# Patient Record
Sex: Female | Born: 1995 | Race: Black or African American | Hispanic: No | Marital: Single | Smoking: Former smoker
Health system: Southern US, Community
[De-identification: ages and names within clinical notes are randomized; demographics above are authoritative.]

## PROBLEM LIST (undated history)

## (undated) ENCOUNTER — Inpatient Hospital Stay (HOSPITAL_COMMUNITY): Payer: Self-pay

## (undated) DIAGNOSIS — N390 Urinary tract infection, site not specified: Secondary | ICD-10-CM

## (undated) DIAGNOSIS — I10 Essential (primary) hypertension: Secondary | ICD-10-CM

## (undated) HISTORY — PX: NO PAST SURGERIES: SHX2092

---

## 2014-10-27 LAB — OB RESULTS CONSOLE HIV ANTIBODY (ROUTINE TESTING): HIV: NONREACTIVE

## 2014-10-27 LAB — OB RESULTS CONSOLE RPR: RPR: NONREACTIVE

## 2014-10-27 LAB — OB RESULTS CONSOLE HEPATITIS B SURFACE ANTIGEN: Hepatitis B Surface Ag: NEGATIVE

## 2014-10-27 LAB — OB RESULTS CONSOLE RUBELLA ANTIBODY, IGM: RUBELLA: IMMUNE

## 2014-12-08 LAB — OB RESULTS CONSOLE GC/CHLAMYDIA
Chlamydia: NEGATIVE
GC PROBE AMP, GENITAL: NEGATIVE

## 2015-01-31 NOTE — L&D Delivery Note (Addendum)
Final Labor Progress Note At  1020 pt reports an increased in rectal pressure.  VE C/C/+2.  FHR remained reassuring with occasional decelerations.  Vaginal Delivery Note The pt utilized an epidural as pain management.   Artificial rupture of membranes today, at 0936, clear.  GBS was positive, PCN x 7 doses were given.  Cervical dilation was complete at  0936.  NICHD Category 2.    Pushing with guidance began at  0850.   After 1 hour and 42 minutes of pushing the head, shoulders and the body of a viable female infant "[name]" delivered spontaneously with maternal effort in the LOP position at 1032. Loose [place] x 1 infant somersaulted thru without difficulty.  With decreased tone and minimal cry, the infant was placed on moms abd and provided tactile stimulation. After the umbilical cord was clamped it was cut by the FOB.  While the cord blood was obtained for evaluation the infant taken to the warmer by the nursing team for further tactile stimulation.   Spontaneous delivery of a intact placenta with a 3 vessel cord via [place] at 1038.   Episiotomy: None   The vulva, perineum, vaginal vault, rectum and cervix were inspected no repairs needed.   Postpartum pitocin as ordered.  Fundus firm, lochia minimum, bleeding under control.  EBL 100, Pt hemodynamically stable.   Sponge, laps and needle count correct and verified with the primary care nurse.  Attending MD available at all times.    Routine postpartum orders   Mother unsure about method of contraception Mom does not plan to breastfeed, prefers to bottlefeed Infant to have outpatient circumcision   Placenta to pathology: NO     Cord Gases sent to lab: NO Cord blood sent to lab: YES   APGARS:  6 at 1 minute and 9 at 5 minutes Weight:. 5lbs 2.2 oz   Both mom and baby were left in stable condition, baby skin to skin.   [name], CNM, MSN 05/06/2015. 11:33 AM

## 2015-04-28 LAB — OB RESULTS CONSOLE GBS: STREP GROUP B AG: POSITIVE

## 2015-05-04 ENCOUNTER — Other Ambulatory Visit: Payer: Self-pay | Admitting: Obstetrics and Gynecology

## 2015-05-04 ENCOUNTER — Telehealth (HOSPITAL_COMMUNITY): Payer: Self-pay | Admitting: *Deleted

## 2015-05-04 NOTE — H&P (Signed)
Lynn Garcia is a 20 y.o. female, G1P0 at 38.6 weeks, presenting for IOL secondary to oligohydramnios and chronic hypertension.  Patient personal and pregnancy history also significant for depressive disorder, ADHD, and GBS positive status.    Patient Active Problem List   Diagnosis Date Noted  . Oligohydramnios in third trimester 05/05/2015  . Chronic hypertension 05/05/2015    History of present pregnancy: Patient entered care at 11.5 weeks at [facility] in [place]. Limited PN Records Obtained EDC of 05/13/2015 was established by Definite LMP of 08/06/2014.   Anatomy scan:  Unknown weeks, with normal findings and an anterior placenta.   Additional US evaluations:  38wks:  VTX, EFW 6+4, 32%ILE, AFI 6.35, 3%ILE, ANTERIOR PLACENTA. BPP 8/8.  38.4wks: SIUP, vertex, anterior placenta, AFI=5cm, <3%, cervix not seen per protocol. Bpp 8/8 in 15 min. [place]/ovaries unremarkable Significant prenatal events: 1st Trimester: Treated for trichomoniasis and started on zoloft for depression.  Patient reports taking one pill and d/c'd due to not liking effects.  Patient with positive drug screen for MJ usage, reports d/c after positive pregnancy test.  2nd Trimester: Unknown 3rd Trimester:   Patient transferred to [facility] at 37.5wks for LA after a 4 week lapse in care. Reports contractions that had to be stopped at 32 weeks. Patient c/o increased swelling, headache, and elevated bp.   Last evaluation:  05/03/2015 in office by R. Stall, CNM FHR 140.  BP 146/96 Wt 173.5, TWG 60lbs  OB History    No data available     No past medical history on file. No past surgical history on file. Family History: family history is not on file. Social History:  has no tobacco, alcohol, and drug history on file.   Prenatal Transfer Tool  Maternal Diabetes: No Genetic Screening: Normal Maternal Ultrasounds/Referrals: Abnormal:  Findings:   Other: Oligo at 38wks Fetal Ultrasounds or other Referrals:   None Maternal Substance Abuse:  Yes:  Type: Marijuana Significant Maternal Medications:  None Significant Maternal Lab Results: Lab values include: Group B Strep positive   ROS:  -Ctx, -LoF, -VB, +FM Patient denies GI issues and recent illness Patient reports current headache and denies visual disturbances, epigastric pain, and SOB  Allergies not on file     Height 5\' 1"  (1.549 m), weight 74.844 kg (165 lb).  Physical Exam  Constitutional: She is oriented to person, place, and time. She appears well-developed and well-nourished. No distress.  HENT:  Head: Normocephalic and atraumatic.  Eyes: Conjunctivae are normal.  Neck: Normal range of motion.  Cardiovascular: Normal rate and normal heart sounds.   Respiratory: Effort normal and breath sounds normal.  GI: Soft. Bowel sounds are normal.  Gravid--fundal height appears AGA, Soft, NT  Musculoskeletal: Normal range of motion. She exhibits edema (+1 Pitting in BLE).  Neurological: She is alert and oriented to person, place, and time.  Skin: Skin is warm and dry.  Psychiatric: She has a normal mood and affect. Her behavior is normal.    [place]: EFW: 6lb 4oz by 38wk US Presentation: Vertex by 38wk US  FHR: 125 bpm, Min Var, +Variable Decels, +Accels UCs:  Q2-544min, palpates mild  Prenatal labs: ABO, Rh:  A Positive Antibody:  Negative Rubella:  Immune RPR:   NR HBsAg:   Negative HIV:   NR GBS:  Positive Sickle cell/Hgb electrophoresis:  Normal Pap:  N/A GC:  Negative Chlamydia:  Negative Other:  3/30: [facility] Labs-HGB 12.5, PLAT 205, WBC 11.3, URIC ACID 4.3, LDH 196, BUN 0.63, CREAT  7, PCR 0.113. MJ Negative  28 week labs 02/16/15--GTT 87, Hgb 12.9, plat 230, HIV NR, RPR NR; CF testing negative, Quad screen negative 11/6; GC/chlamydia negative 12/08/14 and 10/27/14. 10/27/14 Rubella immune, Hep B negative, RPR NR, HIV NR. UDS + MJ, Hgb 12.4, plat 229, A+, antibody screen neg, urine culture Ecoli at [facility], hgb electrophoresis  AA   Assessment IUP at 38.6wks Cat I FT CHTN Oligohydramnios IOL GBS Positive Depressive D/O Headache  Plan: Admit to [employer] per medical induction protocol Routine Labor and Delivery Orders per [facility] Protocol Routine Induction/Augmentation Orders [facility] labs for baseline reference In room to complete assessment and discuss POC: Cytotec placed by nurse, per provider orders,at 0145 Discussed r/b of induction including fetal distress, serial induction, pain, and increased risk of c/s delivery Will give tylenol for headache Encouraged rest Dr.EK to be updated as appropriate  Joellyn Quails, MSN 05/04/2015, 9:47 PM

## 2015-05-04 NOTE — Telephone Encounter (Signed)
Preadmission screen  

## 2015-05-05 ENCOUNTER — Inpatient Hospital Stay (HOSPITAL_COMMUNITY): Payer: Medicaid Other | Admitting: Anesthesiology

## 2015-05-05 ENCOUNTER — Encounter (HOSPITAL_COMMUNITY): Payer: Self-pay

## 2015-05-05 ENCOUNTER — Inpatient Hospital Stay (HOSPITAL_COMMUNITY)
Admission: RE | Admit: 2015-05-05 | Discharge: 2015-05-08 | DRG: 774 | Disposition: A | Discharge status: Home / Self Care | Payer: Medicaid Other | Source: Ambulatory Visit | Attending: Obstetrics and Gynecology | Admitting: Obstetrics and Gynecology

## 2015-05-05 DIAGNOSIS — Z3A38 38 weeks gestation of pregnancy: Secondary | ICD-10-CM

## 2015-05-05 DIAGNOSIS — O1002 Pre-existing essential hypertension complicating childbirth: Secondary | ICD-10-CM | POA: Diagnosis present

## 2015-05-05 DIAGNOSIS — O4103X0 Oligohydramnios, third trimester, not applicable or unspecified: Secondary | ICD-10-CM | POA: Diagnosis present

## 2015-05-05 DIAGNOSIS — O99344 Other mental disorders complicating childbirth: Secondary | ICD-10-CM | POA: Diagnosis present

## 2015-05-05 DIAGNOSIS — O99824 Streptococcus B carrier state complicating childbirth: Secondary | ICD-10-CM | POA: Diagnosis present

## 2015-05-05 DIAGNOSIS — I10 Essential (primary) hypertension: Secondary | ICD-10-CM | POA: Diagnosis present

## 2015-05-05 DIAGNOSIS — F329 Major depressive disorder, single episode, unspecified: Secondary | ICD-10-CM | POA: Diagnosis present

## 2015-05-05 DIAGNOSIS — F909 Attention-deficit hyperactivity disorder, unspecified type: Secondary | ICD-10-CM | POA: Diagnosis present

## 2015-05-05 LAB — RAPID HIV SCREEN (HIV 1/2 AB+AG)
HIV 1/2 ANTIBODIES: NONREACTIVE
HIV-1 P24 ANTIGEN - HIV24: NONREACTIVE

## 2015-05-05 LAB — CBC
HCT: 37 % (ref 36.0–46.0)
HEMATOCRIT: 36.8 % (ref 36.0–46.0)
HEMOGLOBIN: 13.1 g/dL (ref 12.0–15.0)
HEMOGLOBIN: 13 g/dL (ref 12.0–15.0)
MCH: 31.5 pg (ref 26.0–34.0)
MCH: 31.4 pg (ref 26.0–34.0)
MCHC: 35.6 g/dL (ref 30.0–36.0)
MCHC: 35.1 g/dL (ref 30.0–36.0)
MCV: 88.5 fL (ref 78.0–100.0)
MCV: 89.4 fL (ref 78.0–100.0)
PLATELETS: 177 10*3/uL (ref 150–400)
Platelets: 201 10*3/uL (ref 150–400)
RBC: 4.16 MIL/uL (ref 3.87–5.11)
RBC: 4.14 MIL/uL (ref 3.87–5.11)
RDW: 14.1 % (ref 11.5–15.5)
RDW: 14.1 % (ref 11.5–15.5)
WBC: 13.4 10*3/uL — ABNORMAL HIGH (ref 4.0–10.5)
WBC: 13.8 10*3/uL — ABNORMAL HIGH (ref 4.0–10.5)

## 2015-05-05 LAB — COMPREHENSIVE METABOLIC PANEL
ALBUMIN: 3.3 g/dL — AB (ref 3.5–5.0)
ALK PHOS: 92 U/L (ref 38–126)
ALT: 18 U/L (ref 14–54)
AST: 19 U/L (ref 15–41)
Anion gap: 6 (ref 5–15)
BUN: 9 mg/dL (ref 6–20)
CALCIUM: 8.7 mg/dL — AB (ref 8.9–10.3)
CO2: 23 mmol/L (ref 22–32)
CREATININE: 0.58 mg/dL (ref 0.44–1.00)
Chloride: 106 mmol/L (ref 101–111)
GFR calc Af Amer: >60 mL/min (ref >60)
GFR calc non Af Amer: >60 mL/min (ref >60)
GLUCOSE: 76 mg/dL (ref 65–99)
Potassium: 4 mmol/L (ref 3.5–5.1)
SODIUM: 135 mmol/L (ref 135–145)
Total Bilirubin: 0.2 mg/dL — ABNORMAL LOW (ref 0.3–1.2)
Total Protein: 6.8 g/dL (ref 6.5–8.1)

## 2015-05-05 LAB — LACTATE DEHYDROGENASE: LDH: 174 U/L (ref 98–192)

## 2015-05-05 LAB — TYPE AND SCREEN
ABO/RH(D): A POS
Antibody Screen: NEGATIVE

## 2015-05-05 LAB — PROTEIN / CREATININE RATIO, URINE
Creatinine, Urine: 197 mg/dL
Protein Creatinine Ratio: 0.1 mg/mg{Cre} (ref 0.00–0.15)
Total Protein, Urine: 19 mg/dL

## 2015-05-05 LAB — ABO/RH: ABO/RH(D): A POS

## 2015-05-05 LAB — URIC ACID: Uric Acid, Serum: 4.6 mg/dL (ref 2.3–6.6)

## 2015-05-05 LAB — RPR: RPR Ser Ql: NONREACTIVE

## 2015-05-05 MED ORDER — ONDANSETRON HCL 4 MG/2ML IJ SOLN: 4.0000 mg | Freq: Four times a day (QID) | INTRAMUSCULAR | Status: DC | PRN

## 2015-05-05 MED ORDER — OXYTOCIN BOLUS FROM INFUSION
500.0000 mL | INTRAVENOUS | Status: DC
  Administered 2015-05-06: 500 mL via INTRAVENOUS

## 2015-05-05 MED ORDER — FENTANYL 2.5 MCG/ML BUPIVACAINE 1/10 % EPIDURAL INFUSION (WH - ANES)
14.0000 mL/h | INTRAMUSCULAR | Status: DC | PRN
  Administered 2015-05-05 – 2015-05-06 (×4): 14 mL/h via EPIDURAL
  Filled 2015-05-05 (×3): qty 125

## 2015-05-05 MED ORDER — LACTATED RINGERS IV SOLN
500.0000 mL | Freq: Once | INTRAVENOUS | Status: AC
  Administered 2015-05-05: 500 mL via INTRAVENOUS

## 2015-05-05 MED ORDER — LABETALOL HCL 5 MG/ML IV SOLN: 20.0000 mg | INTRAVENOUS | Status: DC | PRN

## 2015-05-05 MED ORDER — LIDOCAINE HCL (PF) 1 % IJ SOLN
INTRAMUSCULAR | Status: DC | PRN
  Administered 2015-05-05: 5 mL
  Administered 2015-05-05: 2 mL
  Administered 2015-05-05: 3 mL

## 2015-05-05 MED ORDER — PENICILLIN G POTASSIUM 5000000 UNITS IJ SOLR
2.5000 10*6.[IU] | INTRAVENOUS | Status: DC
  Filled 2015-05-05 (×3): qty 2.5

## 2015-05-05 MED ORDER — TERBUTALINE SULFATE 1 MG/ML IJ SOLN
0.2500 mg | Freq: Once | INTRAMUSCULAR | Status: DC | PRN
Start: 2015-05-05 — End: 2015-05-06
  Filled 2015-05-05: qty 1

## 2015-05-05 MED ORDER — PENICILLIN G POTASSIUM 5000000 UNITS IJ SOLR
2.5000 10*6.[IU] | INTRAVENOUS | Status: DC
  Administered 2015-05-05 – 2015-05-06 (×6): 2.5 10*6.[IU] via INTRAVENOUS
  Filled 2015-05-05 (×10): qty 2.5

## 2015-05-05 MED ORDER — DEXTROSE 5 % IV SOLN
5.0000 10*6.[IU] | Freq: Once | INTRAVENOUS | Status: AC
  Administered 2015-05-05: 5 10*6.[IU] via INTRAVENOUS
  Filled 2015-05-05 (×2): qty 5

## 2015-05-05 MED ORDER — FENTANYL CITRATE (PF) 100 MCG/2ML IJ SOLN
50.0000 ug | INTRAMUSCULAR | Status: DC | PRN
  Administered 2015-05-05: 100 ug via INTRAVENOUS
  Filled 2015-05-05: qty 2

## 2015-05-05 MED ORDER — LACTATED RINGERS IV SOLN
2.5000 [IU]/h | INTRAVENOUS | Status: DC
  Filled 2015-05-05: qty 4

## 2015-05-05 MED ORDER — PHENYLEPHRINE 40 MCG/ML (10ML) SYRINGE FOR IV PUSH (FOR BLOOD PRESSURE SUPPORT)
80.0000 ug | PREFILLED_SYRINGE | INTRAVENOUS | Status: DC | PRN
  Filled 2015-05-05: qty 20
  Filled 2015-05-05: qty 2

## 2015-05-05 MED ORDER — HYDRALAZINE HCL 20 MG/ML IJ SOLN: 10.0000 mg | Freq: Once | INTRAMUSCULAR | Status: DC | PRN

## 2015-05-05 MED ORDER — OXYTOCIN 10 UNIT/ML IJ SOLN
1.0000 m[IU]/min | INTRAVENOUS | Status: DC
  Administered 2015-05-05: 1 m[IU]/min via INTRAVENOUS

## 2015-05-05 MED ORDER — LACTATED RINGERS IV SOLN
INTRAVENOUS | Status: DC
  Administered 2015-05-05 – 2015-05-06 (×4): via INTRAVENOUS

## 2015-05-05 MED ORDER — LIDOCAINE HCL (PF) 1 % IJ SOLN
30.0000 mL | INTRAMUSCULAR | Status: DC | PRN
  Filled 2015-05-05: qty 30

## 2015-05-05 MED ORDER — EPHEDRINE 5 MG/ML INJ
10.0000 mg | INTRAVENOUS | Status: DC | PRN
  Filled 2015-05-05: qty 2

## 2015-05-05 MED ORDER — LACTATED RINGERS IV SOLN
500.0000 mL | INTRAVENOUS | Status: DC | PRN
  Administered 2015-05-05 (×2): 500 mL via INTRAVENOUS

## 2015-05-05 MED ORDER — PHENYLEPHRINE 40 MCG/ML (10ML) SYRINGE FOR IV PUSH (FOR BLOOD PRESSURE SUPPORT)
80.0000 ug | PREFILLED_SYRINGE | INTRAVENOUS | Status: DC | PRN
  Filled 2015-05-05: qty 2

## 2015-05-05 MED ORDER — LACTATED RINGERS IV SOLN: 500.0000 mL | Freq: Once | INTRAVENOUS | Status: DC

## 2015-05-05 MED ORDER — LACTATED RINGERS IV SOLN
INTRAVENOUS | Status: DC
  Administered 2015-05-05 – 2015-05-06 (×2): via INTRAUTERINE

## 2015-05-05 MED ORDER — CITRIC ACID-SODIUM CITRATE 334-500 MG/5ML PO SOLN: 30.0000 mL | ORAL | Status: DC | PRN

## 2015-05-05 MED ORDER — ACETAMINOPHEN 325 MG PO TABS
650.0000 mg | ORAL_TABLET | ORAL | Status: DC | PRN
  Administered 2015-05-05: 650 mg via ORAL
  Filled 2015-05-05: qty 2

## 2015-05-05 MED ORDER — MISOPROSTOL 25 MCG QUARTER TABLET
25.0000 ug | ORAL_TABLET | ORAL | Status: DC | PRN
  Administered 2015-05-05: 25 ug via VAGINAL
  Filled 2015-05-05: qty 1
  Filled 2015-05-05: qty 0.25

## 2015-05-05 MED ORDER — EPHEDRINE 5 MG/ML INJ
10.0000 mg | INTRAVENOUS | Status: DC | PRN
Start: 2015-05-05 — End: 2015-05-06
  Filled 2015-05-05: qty 2

## 2015-05-05 MED ORDER — DIPHENHYDRAMINE HCL 50 MG/ML IJ SOLN: 12.5000 mg | INTRAMUSCULAR | Status: DC | PRN

## 2015-05-05 NOTE — Anesthesia Preprocedure Evaluation (Signed)
Anesthesia Evaluation  Patient identified by MRN, date of birth, ID band Patient awake    Reviewed: Allergy & Precautions, NPO status , Patient's Chart, lab work & pertinent test results  Airway Mallampati: II       Dental   Pulmonary neg pulmonary ROS,    Pulmonary exam normal        Cardiovascular hypertension, Normal cardiovascular exam     Neuro/Psych Depression negative neurological ROS     GI/Hepatic negative GI ROS, Neg liver ROS,   Endo/Other  negative endocrine ROS  Renal/GU negative Renal ROS     Musculoskeletal   Abdominal   Peds  Hematology negative hematology ROS (+)   Anesthesia Other Findings   Reproductive/Obstetrics (+) Pregnancy (IOL for oligo and cHTN)                             Lab Results  Component Value Date   WBC 13.8* 05/05/2015   HGB 13.0 05/05/2015   HCT 37.0 05/05/2015   MCV 89.4 05/05/2015   PLT 177 05/05/2015   Lab Results  Component Value Date   CREATININE 0.58 05/05/2015   BUN 9 05/05/2015   NA 135 05/05/2015   K 4.0 05/05/2015   CL 106 05/05/2015   CO2 23 05/05/2015    Anesthesia Physical Anesthesia Plan  ASA: II  Anesthesia Plan: Epidural   Post-op Pain Management:    Induction:   Airway Management Planned: Natural Airway  Additional Equipment:   Intra-op Plan:   Post-operative Plan:   Informed Consent: I have reviewed the patients History and Physical, chart, labs and discussed the procedure including the risks, benefits and alternatives for the proposed anesthesia with the patient or authorized representative who has indicated his/her understanding and acceptance.     Plan Discussed with:   Anesthesia Plan Comments:         Anesthesia Quick Evaluation

## 2015-05-05 NOTE — Progress Notes (Signed)
Subjective: Tired but comfortable. FOB at bedside for support  Objective: BP 121/82 mmHg  Pulse 87  Temp(Src) 98.1 F (36.7 C) (Oral)  Resp 18  Ht 5\' 1"  (1.549 m)  Wt 74.844 kg (165 lb)  BMI 31.19 kg/m2  SpO2 100%      FHT: Category 2, 130 bpm, moderate variability, +accels, occasional variables UC:   regular, every 2-4 minutes SVE:   Dilation: 4.5 Effacement (%): 80 Station: -2, -1 Exam by:: R Corrisa Gibby CNM Membranes: AROM at 0936, scant, blood tinged fluid Induction: S/p cytotec, 1 dose at 0145 Pitocin: none Internal monitors: IUPC, replaced at 1354, placed without difficulty  MVus; 130-170 FSE: Placed without difficulty at 0954  Pain management: Epidrual GBS prophylaxis, x2 doses  Assessment:  IUP at 38.6wks IOL for CHTN, Oligohydramnios INadequate contractions AROM x 8.5 hrs Cat 2 GBS Positive  Plan: Continue current management  May consider augmentation with Pitocin, 1x1, if MVus continue to remain below 180.   Alphonzo Severanceachel Javaria Knapke CNM, [place] 05/05/2015, 6:04 PM

## 2015-05-05 NOTE — Anesthesia Procedure Notes (Signed)
Epidural Patient location during procedure: OB  Staffing Anesthesiologist: Marcene DuosFITZGERALD, Donzella Carrol Performed by: anesthesiologist   Preanesthetic Checklist Completed: patient identified, site marked, surgical consent, pre-op evaluation, timeout performed, IV checked, risks and benefits discussed and monitors and equipment checked  Epidural Patient position: sitting Prep: site prepped and draped and DuraPrep Patient monitoring: continuous pulse ox and blood pressure Approach: midline Location: L3-L4 Injection technique: LOR saline  Needle:  Needle type: Tuohy  Needle gauge: 17 G Needle length: 9 cm and 9 Needle insertion depth: 7 cm Catheter type: closed end flexible Catheter size: 19 Gauge Catheter at skin depth: 13 (12cm initially at the skin. Advanced to 13cm when pt laid in right lat decubitus position.) cm Test dose: negative  Assessment Events: blood not aspirated, injection not painful, no injection resistance, negative IV test and no paresthesia

## 2015-05-05 NOTE — Progress Notes (Signed)
Subjective:  Pt now comfortable with epidural, family at bedside   Objective: BP 118/74 mmHg  Pulse 90  Temp(Src) 97.9 F (36.6 C) (Oral)  Resp 18  Ht 5\' 1"  (1.549 m)  Wt 74.844 kg (165 lb)  BMI 31.19 kg/m2  SpO2 99%      FHT: Overall Cat 1 with  Intermittent Category 2 tracing when pt is on her back, 125 bpm, min-moderate variability, +accels,  decelerations UC:   regular, every 2-3 minutes SVE:   Dilation: 4 Effacement (%): 80 Station: -2 Exam by:: Alphonzo SeveranceStall, Chameka Mcmullen CNM  Membranes: AROM at [phone], scant, blood tinged fluid Induction: S/p cytotec, 1 dose at 0145 Pitocin: none Internal monitors: IUPC, replaced at 1354, placed without difficulty   MVus;   200 FSE: Placed without difficulty at 0954  Pain management:  Epidrual GBS prophylaxis,  x2 doses  Assessment:  IUP at 38.6wks IOL for CHTN, Oligohydramnios Adequate contractions AROM x 7hrs Cat 2 GBS Positive  Plan: Continue current management   Alphonzo SeveranceRachel Chaniyah Jahr CNM, [place] 05/05/2015, 3:47 PM

## 2015-05-05 NOTE — Progress Notes (Signed)
Labor Progress  Subjective: No complaints, comfortable with epidural.  Reviewed strip, explained the decels and the possibility of the need for a CS.  Pt expressed understanding and ok with the POC  Objective: BP 140/89 mmHg  Pulse 83  Temp(Src) 98.4 F (36.9 C) (Oral)  Resp 18  Ht 5\' 1"  (1.549 m)  Wt 165 lb (74.844 kg)  BMI 31.19 kg/m2  SpO2 100% I/O last 3 completed shifts: In: -  Out: 575 [Urine:575]   FHT: 135, moderate variability, + accel, ;early, variable decels CTX:  irregular, every 2-6 minutes Uterus gravid, soft non tender SVE:  Dilation: 4 Effacement (%): 80 Station: -2 Exam by:: v. Maveryk Renstrom Pitocin at 571mUn/min at 2008  Assessment:  IUP at 38.6 weeks IOL d/t cHTN NICHD: Category 2,  Membranes:  AROM x 12hrs, no s/s of infection Induction: pitocin Pain management: Epidural placement: at 1533 on 05/05/15 GBS positive Dr Molly Maduroobert aware, strip reviewed. IUPC/FSE  Plan: Continue labor plan Continuous monitoring Will reassess with cervical exam at 1000 or earlier if necessary Stop pitocin at 2053 Amnioinfusion with bolus of 300 cc only     Guido Comp, CNM, MSN 05/05/2015. 9:09 PM  Addendum Tachysystole, IUPC not adequately picking up ctx IUPC replaced at 2132  2148 FHR 140, + accel, variable and early decel  As of 2205 category 1, FHR 135, moderate variability,  No decels, ctx q 3 adequate

## 2015-05-05 NOTE — Progress Notes (Signed)
Subjective: In room to greet patient at 0730. Pt resting, doing well, feeling contractions and coping. Returned to room at 0930 to discuss plan of care with patient.   Objective: BP 127/73 mmHg  Pulse 78  Temp(Src) 97.9 F (36.6 C) (Oral)  Resp 18  Ht 5\' 1"  (1.549 m)  Wt 74.844 kg (165 lb)  BMI 31.19 kg/m2      Filed Vitals:   05/05/15 0237 05/05/15 0524 05/05/15 0603 05/05/15 0900  BP: 130/83 132/76 127/73   Pulse: 79 72 78   Temp:  97.8 F (36.6 C)  97.9 F (36.6 C)  TempSrc:  Oral  Oral  Resp: 16 18    Height:      Weight:       Results for orders placed or performed during the hospital encounter of 05/05/15 (from the past 24 hour(s))  CBC     Status: Abnormal   Collection Time: 05/05/15  2:11 AM  Result Value Ref Range   WBC 13.4 (H) 4.0 - 10.5 K/uL   RBC 4.16 3.87 - 5.11 MIL/uL   Hemoglobin 13.1 12.0 - 15.0 g/dL   HCT 19.136.8 47.836.0 - 29.546.0 %   MCV 88.5 78.0 - 100.0 fL   MCH 31.5 26.0 - 34.0 pg   MCHC 35.6 30.0 - 36.0 g/dL   RDW 62.114.1 30.811.5 - 65.715.5 %   Platelets 201 150 - 400 K/uL  Type and screen [facility]     Status: None   Collection Time: 05/05/15  2:11 AM  Result Value Ref Range   ABO/RH(D) A POS    Antibody Screen NEG    Sample Expiration 05/08/2015   Comprehensive metabolic panel     Status: Abnormal   Collection Time: 05/05/15  2:11 AM  Result Value Ref Range   Sodium 135 135 - 145 mmol/L   Potassium 4.0 3.5 - 5.1 mmol/L   Chloride 106 101 - 111 mmol/L   CO2 23 22 - 32 mmol/L   Glucose, Bld 76 65 - 99 mg/dL   BUN 9 6 - 20 mg/dL   Creatinine, Ser 8.460.58 0.44 - 1.00 mg/dL   Calcium 8.7 (L) 8.9 - 10.3 mg/dL   Total Protein 6.8 6.5 - 8.1 g/dL   Albumin 3.3 (L) 3.5 - 5.0 g/dL   AST 19 15 - 41 U/L   ALT 18 14 - 54 U/L   Alkaline Phosphatase 92 38 - 126 U/L   Total Bilirubin 0.2 (L) 0.3 - 1.2 mg/dL   GFR calc non Af Amer >60 >60 mL/min   GFR calc Af Amer >60 >60 mL/min   Anion gap 6 5 - 15  Lactate dehydrogenase     Status: None   Collection Time: 05/05/15  2:11 AM  Result Value Ref Range   LDH 174 98 - 192 U/L  Uric acid     Status: None   Collection Time: 05/05/15  2:11 AM  Result Value Ref Range   Uric Acid, Serum 4.6 2.3 - 6.6 mg/dL  Rapid HIV screen (HIV 1/2 Ab+Ag) ([facility] Only)     Status: None   Collection Time: 05/05/15  2:11 AM  Result Value Ref Range   HIV-1 P24 Antigen - HIV24 NON REACTIVE NON REACTIVE   HIV 1/2 Antibodies NON REACTIVE NON REACTIVE   Interpretation (HIV Ag Ab)      A non reactive test result means that HIV 1 or HIV 2 antibodies and HIV 1 p24 antigen were not detected in the  specimen.  ABO/Rh     Status: None   Collection Time: 05/05/15  2:11 AM  Result Value Ref Range   ABO/RH(D) A POS   Protein / creatinine ratio, urine     Status: None   Collection Time: 05/05/15  5:15 AM  Result Value Ref Range   Creatinine, Urine 197.00 mg/dL   Total Protein, Urine 19 mg/dL   Protein Creatinine Ratio 0.10 0.00 - 0.15 mg/mg[Cre]     . pencillin G potassium IV  5 Million Units Intravenous Once   Followed by  . pencillin G potassium IV  2.5 Million Units Intravenous Q4H  . pencillin G potassium IV  2.5 Million Units Intravenous Q4H      FHT: Category 2, 130 bpm, min-moderate variability, +accels, deep variable decelerations noted UC:   regular, every 2-5 minutes  SVE:   3/80/-2 Membranes: APOM at 0936, scant, blood tinged fluid Induction: S/p cytotec, 1 dose at 0145 Pitocin: none Internal monitors: IUPC, placed at 0938, placed without difficulty   Note:  fetal bradycardia/prolonged deceleration noted into 50's. After AROM/IUPC placement that resolved with  intrauterine resuscitation measured- maternal position change into right lateral position, IV fluid bolus and oxygen.   Dr. Su Hilt called for evaluation during fetal bradycardiaand in room to evaluate as bradycardia resolved.             MVus: 200 -240 FSE:  Placed without difficulty at 0954  Pain management: currently utilizing  coping skills GBS prophylaxis, 1x dose infusing now  Assessment:  IUP at 38.6wks  Cat 2 FT CHTN Oligohydramnios IOL  GBS Positive Depressive D/O   Plan: Routine Labor and Delivery Orders per [facility] Protocol Routine Induction/Augmentation Orders Consult with Dr. Su Hilt for POC -  Recommendation to AROM and place FSE & IUPC Discussed r/b of AROM, internal monitors with pt,  including fetal distress, serial induction, pain, and increased risk of c/s delivery , pt agreeable Continue intrauterine resuscitation measures, prn May consider Amnio-infusion if variables persist  Watch for Hyperstimulation -  Utilize terbutaline, prn Encourage frequent position change to facilitate fetal rotation and descent  Encouraged rest Dr. Su Hilt to be updated as appropriate  Alphonzo Severance CNM, [place] 05/05/2015, 10:09 AM

## 2015-05-05 NOTE — Progress Notes (Signed)
Pauline GoodHeroinesha Ionescu MRN: [id number]  Subjective: -Strip reviewed and of concern.  In room to assess.  Patient resting in bed. Reports lower abdominal discomfort.   Objective: BP 130/83 mmHg  Pulse 79  Resp 16  Ht 5\' 1"  (1.549 m)  Wt 74.844 kg (165 lb)  BMI 31.19 kg/m2      Fetal Monitoring: FHT: 135 bpm, Mod Var, + Prolonged Variable Decels, +Accels UC: Q2-1005min, palpates mild    Vaginal Exam: SVE:   Dilation: 2 Effacement (%): 50 Station: -3 Exam by:: Sabas SousJ. Deniese Oberry, CNM Membranes:Intact Internal Monitors: None  Augmentation/Induction: Pitocin:None Cytotec: S/P 1 Dose  Assessment:  IUP at 38.6wks Cat II FT  CHTN Oligo Bishop Score: 3  Plan: -Position change, fluid bolus to promote resolution of Cat II FT -Will withhold next dose of cytotec until appropriate -Continue other mgmt as ordered  Valma CavaJessica L Crecencio Kwiatek,MSN, CNM 05/05/2015, 5:09 AM

## 2015-05-05 NOTE — Progress Notes (Addendum)
Labor Progress  Subjective: No complaints  Objective: BP 124/66 mmHg  Pulse 101  Temp(Src) 98.3 F (36.8 C) (Oral)  Resp 18  Ht 5\' 1"  (1.549 m)  Wt 165 lb (74.844 kg)  BMI 31.19 kg/m2  SpO2 100% I/O last 3 completed shifts: In: -  Out: 575 [Urine:575] Total I/O In: -  Out: 425 [Urine:425] FHT: 135, moderate variability + accel, early decel CTX:  regular, every 2-4 minutes Uterus gravid, soft non tender SVE:  Dilation: 5 Effacement (%): 80 Station: -2 Exam by:: v Marrell Dicaprio cnm   Assessment:  IUP at 38.6 weeks IOL d/t PIH NICHD: Category 1 Membranes:  AROM x 14hrs, no s/s of infection  MVUs 180-200 Induction:   Cytotec x1  Pitocin   Pain management:  Epidural placement: at 1533 on 05/05/15 GBS positive  Abx:5  Plan: Continue labor plan Continuous monitoring Rest Frequent position changes to facilitate fetal rotation and descent. Will reassess with cervical exam at 0300 or earlier if necessary      Altair Appenzeller, CNM, MSN 05/05/2015. 11:43 PM

## 2015-05-06 ENCOUNTER — Encounter (HOSPITAL_COMMUNITY): Payer: Self-pay

## 2015-05-06 LAB — CBC
HEMATOCRIT: 38.4 % (ref 36.0–46.0)
Hemoglobin: 13.6 g/dL (ref 12.0–15.0)
MCH: 31.7 pg (ref 26.0–34.0)
MCHC: 35.4 g/dL (ref 30.0–36.0)
MCV: 89.5 fL (ref 78.0–100.0)
PLATELETS: 189 10*3/uL (ref 150–400)
RBC: 4.29 MIL/uL (ref 3.87–5.11)
RDW: 14.2 % (ref 11.5–15.5)
WBC: 25.5 10*3/uL — AB (ref 4.0–10.5)

## 2015-05-06 MED ORDER — ONDANSETRON HCL 4 MG PO TABS: 4.0000 mg | ORAL_TABLET | ORAL | Status: DC | PRN

## 2015-05-06 MED ORDER — OXYCODONE-ACETAMINOPHEN 5-325 MG PO TABS: 2.0000 | ORAL_TABLET | ORAL | Status: DC | PRN

## 2015-05-06 MED ORDER — OXYCODONE-ACETAMINOPHEN 5-325 MG PO TABS: 1.0000 | ORAL_TABLET | ORAL | Status: DC | PRN

## 2015-05-06 MED ORDER — DIBUCAINE 1 % RE OINT: 1.0000 "application " | TOPICAL_OINTMENT | RECTAL | Status: DC | PRN

## 2015-05-06 MED ORDER — DOCUSATE SODIUM 100 MG PO CAPS
100.0000 mg | ORAL_CAPSULE | Freq: Two times a day (BID) | ORAL | Status: DC
  Administered 2015-05-06 – 2015-05-08 (×4): 100 mg via ORAL
  Filled 2015-05-06 (×4): qty 1

## 2015-05-06 MED ORDER — TETANUS-DIPHTH-ACELL PERTUSSIS 5-2.5-18.5 LF-MCG/0.5 IM SUSP
0.5000 mL | Freq: Once | INTRAMUSCULAR | Status: AC
  Administered 2015-05-07: 0.5 mL via INTRAMUSCULAR

## 2015-05-06 MED ORDER — BENZOCAINE-MENTHOL 20-0.5 % EX AERO: 1.0000 "application " | INHALATION_SPRAY | CUTANEOUS | Status: DC | PRN

## 2015-05-06 MED ORDER — DIPHENHYDRAMINE HCL 25 MG PO CAPS: 25.0000 mg | ORAL_CAPSULE | Freq: Four times a day (QID) | ORAL | Status: DC | PRN

## 2015-05-06 MED ORDER — LANOLIN HYDROUS EX OINT: TOPICAL_OINTMENT | CUTANEOUS | Status: DC | PRN

## 2015-05-06 MED ORDER — WITCH HAZEL-GLYCERIN EX PADS: 1.0000 "application " | MEDICATED_PAD | CUTANEOUS | Status: DC | PRN

## 2015-05-06 MED ORDER — IBUPROFEN 600 MG PO TABS
600.0000 mg | ORAL_TABLET | Freq: Four times a day (QID) | ORAL | Status: DC
  Administered 2015-05-06 – 2015-05-08 (×8): 600 mg via ORAL
  Filled 2015-05-06 (×8): qty 1

## 2015-05-06 MED ORDER — SIMETHICONE 80 MG PO CHEW
80.0000 mg | CHEWABLE_TABLET | ORAL | Status: DC | PRN
Start: 2015-05-06 — End: 2015-05-08

## 2015-05-06 MED ORDER — PRENATAL MULTIVITAMIN CH
1.0000 | ORAL_TABLET | Freq: Every day | ORAL | Status: DC
  Administered 2015-05-08: 1 via ORAL
  Filled 2015-05-06: qty 1

## 2015-05-06 MED ORDER — ONDANSETRON HCL 4 MG/2ML IJ SOLN: 4.0000 mg | INTRAMUSCULAR | Status: DC | PRN

## 2015-05-06 MED ORDER — ACETAMINOPHEN 325 MG PO TABS
650.0000 mg | ORAL_TABLET | ORAL | Status: DC | PRN
Start: 2015-05-06 — End: 2015-05-08
  Administered 2015-05-06: 650 mg via ORAL
  Filled 2015-05-06: qty 2

## 2015-05-06 MED ORDER — ZOLPIDEM TARTRATE 5 MG PO TABS: 5.0000 mg | ORAL_TABLET | Freq: Every evening | ORAL | Status: DC | PRN

## 2015-05-06 NOTE — Progress Notes (Signed)
Subjective: Assumed care.  In room to greet patient.  Sitting in high fowlers with bed configured like a chair to enable fetal descent and complaints of break through pain. Otherwise, states she is unable to feel pressure or contractions.  Coping well overall, FOB sleeping in bedside couch.   Objective: BP 131/85 mmHg  Pulse 96  Temp(Src) 99.9 F (37.7 C) (Oral)  Resp 18  Ht 5\' 1"  (1.549 m)  Wt 74.844 kg (165 lb)  BMI 31.19 kg/m2  SpO2 98% I/O last 3 completed shifts: In: -  Out: 1000 [Urine:1000]    FHT: Category 2,  140 bp, moderate variability,  +accels, occasional late decelerations UC:   regular, every 2-3 minutes SVE:   Dilation: Lip/rim Effacement (%): 100 Station: 0 Exam by:: v. standard Membranes: AROM at 0936 05/05/15 Induction:  Cytotec x1 Pitocin  Internal Monitors:   IUPC  - [phone] FSE:  Pain management:  Epidural placement: at 1533 on 05/05/15 GBS positive Abx:6    Assessment:  IUP at 39.0 weeks IOL d/t CHTN NICHD: Category 1 Membranes: AROM x 22.5 hrs, no s/s of infection Inadequate contractions, MVUs 145-155 GBS positive   Plan: Continue labor plan Continuous monitoring Continue to utilize intrauterine resuscitation measures, PRN  Encourage Rest Frequent position changes to facilitate fetal rotation and descent. Will reassess with cervical exam at 0830  or earlier if necessary Anticipate SVD  Lynn Garcia CNM, [place] 05/06/2015, 7:43 AM

## 2015-05-06 NOTE — Progress Notes (Addendum)
Addendum 0225 FHR 145 minimum variability, no accel, late decel x3 Category 3 with active intrauterine resuscitative measures  Amnioinfusion bolus   As of 0232 FHR 130, moderate variability , + accel, no decel Category 1

## 2015-05-06 NOTE — Progress Notes (Signed)
Labor Progress  Subjective: No complaints  Objective: BP 123/71 mmHg  Pulse 96  Temp(Src) 99.9 F (37.7 C) (Oral)  Resp 18  Ht 5\' 1"  (1.549 m)  Wt 165 lb (74.844 kg)  BMI 31.19 kg/m2  SpO2 98% I/O last 3 completed shifts: In: -  Out: 575 [Urine:575] Total I/O In: -  Out: 425 [Urine:425] FHT: 130, moderate variability, +accel, no decel CTX:  regular, every 2-3 minutes Uterus gravid, soft non tender SVE:  9/80/0 with a large anterior lip  Assessment:  IUP at 39.0 weeks IOL d/t CHTN NICHD: Category 1 Membranes:  AROM x 19.5hrs, no s/s of infection  MVUs 145-155  Induction:   Cytotec x1  Pitocin  Pain management:  Epidural placement: at 1533 on 05/05/15 GBS positive  Abx:5 Peanut ball  Plan: Continue labor plan Continuous monitoring Rest Frequent position changes to facilitate fetal rotation and descent. Will reassess with cervical exam at 0700 or earlier if necessary      Naquita Nappier, CNM, MSN 05/06/2015. 4:49 AM

## 2015-05-06 NOTE — Progress Notes (Signed)
Subjective: Sleeping in high fowlers, easy to wake  Objective: BP 122/86 mmHg  Pulse 130  Temp(Src) 99.2 F (37.3 C) (Oral)  Resp 16  Ht 5\' 1"  (1.549 m)  Wt 74.844 kg (165 lb)  BMI 31.19 kg/m2  SpO2 98% I/O last 3 completed shifts: In: -  Out: 1000 [Urine:1000]    FHT: Category 2, 140 bpm, moderate variability, +accels, variable decelerations UC:   regular, every 2-3 minutes SVE:   Dilation: 10 Effacement (%): 100 Station: +1 Exam by:: R Ozil Stettler CNM Membranes: AROM at 0936 05/05/15 Induction:  Cytotec x1 Pitocin  Internal Monitors:  [id number] FSE: Pain management:  Epidural placement: at 1533 on 05/05/15 GBS positive Abx:6  Assessment:  IUP at 39.0 weeks IOL d/t CHTN NICHD: Category 2 Membranes: AROM x 23.5 hrs, no s/s of infection Adequate contractions, MVUs 200 GBS positive  Plan: Begin pushing Continuous monitoring Continue to utilize intrauterine resuscitation measures, PRN  Anticipate SVD  Alphonzo Severanceachel Kailey Esquilin CNM, [place] 05/06/2015, 8:42 AM

## 2015-05-06 NOTE — Progress Notes (Signed)
Addendum Strip review FHR 125, moderate variability, +accel, early decel x2 Category 1

## 2015-05-06 NOTE — Progress Notes (Signed)
Labor Progress  Subjective: Pt sleeping.    Objective: BP 124/76 mmHg  Pulse 104  Temp(Src) 98.3 F (36.8 C) (Oral)  Resp 18  Ht 5\' 1"  (1.549 m)  Wt 165 lb (74.844 kg)  BMI 31.19 kg/m2  SpO2 100% I/O last 3 completed shifts: In: -  Out: 575 [Urine:575] Total I/O In: -  Out: 425 [Urine:425] FHT: 135, moderate variability + accel, late decel CTX:  regular, every 2 minutes Uterus gravid, soft non tender SVE:  Dilation: 6 Effacement (%): 80 Station: -2 Exam by:: v. Jennavie Martinek cnm  Assessment:  IUP at 39.0 weeks IOL d/t PIH Late decels at 0149 x3 SP position change to the left at 0140.  4 minute Decel at 0158 SP VE Category 3 with active intrauterine resuscitative measures  Membranes:  AROM x 16.5hrs, no s/s of infection MVUs 110-160  Induction:   Cytotec x1  SP Pitocin    Pain management:  Epidural placement: GBS positive  Plan: Continue labor plan Continuous monitoring Rest Ambulate Frequent position changes to facilitate fetal rotation and descent. Will reassess with cervical exam at 0400 or earlier if necessary     Anayia Eugene, CNM, MSN 05/06/2015. 2:18 AM

## 2015-05-06 NOTE — Anesthesia Postprocedure Evaluation (Signed)
Anesthesia Post Note  Patient: Lynn GoodHeroinesha Verrastro  Procedure(s) Performed: * No procedures listed *  Patient location during evaluation: Mother Baby Anesthesia Type: Epidural Level of consciousness: awake and alert Pain management: pain level controlled Vital Signs Assessment: post-procedure vital signs reviewed and stable Respiratory status: spontaneous breathing Cardiovascular status: stable Postop Assessment: patient able to bend at knees, no signs of nausea or vomiting and adequate PO intake Anesthetic complications: no    Last Vitals:  Filed Vitals:   05/06/15 1230 05/06/15 1330  BP: 153/97 137/88  Pulse: 80 84  Temp: 36.8 C 37 C  Resp: 20 18    Last Pain:  Filed Vitals:   05/06/15 1502  PainSc: 4                  Zeph Riebel Hristova

## 2015-05-07 LAB — CBC
HEMATOCRIT: 36.6 % (ref 36.0–46.0)
HEMOGLOBIN: 12.8 g/dL (ref 12.0–15.0)
MCH: 31.1 pg (ref 26.0–34.0)
MCHC: 35 g/dL (ref 30.0–36.0)
MCV: 89.1 fL (ref 78.0–100.0)
Platelets: 191 10*3/uL (ref 150–400)
RBC: 4.11 MIL/uL (ref 3.87–5.11)
RDW: 14.4 % (ref 11.5–15.5)
WBC: 19.4 10*3/uL — ABNORMAL HIGH (ref 4.0–10.5)

## 2015-05-07 LAB — COMPREHENSIVE METABOLIC PANEL
ALBUMIN: 2.9 g/dL — AB (ref 3.5–5.0)
ALK PHOS: 86 U/L (ref 38–126)
ALT: 15 U/L (ref 14–54)
AST: 23 U/L (ref 15–41)
Anion gap: 8 (ref 5–15)
BILIRUBIN TOTAL: 0.1 mg/dL — AB (ref 0.3–1.2)
BUN: 10 mg/dL (ref 6–20)
CALCIUM: 8.7 mg/dL — AB (ref 8.9–10.3)
CO2: 23 mmol/L (ref 22–32)
Chloride: 110 mmol/L (ref 101–111)
Creatinine, Ser: 0.72 mg/dL (ref 0.44–1.00)
GFR calc Af Amer: >60 mL/min (ref >60)
GFR calc non Af Amer: >60 mL/min (ref >60)
GLUCOSE: 78 mg/dL (ref 65–99)
POTASSIUM: 4.1 mmol/L (ref 3.5–5.1)
Sodium: 141 mmol/L (ref 135–145)
TOTAL PROTEIN: 6.4 g/dL — AB (ref 6.5–8.1)

## 2015-05-07 LAB — PROTEIN / CREATININE RATIO, URINE
Creatinine, Urine: 182 mg/dL
PROTEIN CREATININE RATIO: 0.09 mg/mg{creat} (ref 0.00–0.15)
Total Protein, Urine: 17 mg/dL

## 2015-05-07 LAB — URIC ACID: URIC ACID, SERUM: 5.3 mg/dL (ref 2.3–6.6)

## 2015-05-07 NOTE — Progress Notes (Signed)
Notified Lynn ScarletKimberly Williams CNM of elevated BP this AM. Pt asymptomatic, had been up in room preparing to go to NICU to see baby. Attempted to get BP again x2 in left arm, both readings low150s/low100s. NSL left in place. No new orders received. Will continue to monitor pt and attempt repeat BP at shift change.

## 2015-05-07 NOTE — Progress Notes (Signed)
Pt off unit to NICU.

## 2015-05-07 NOTE — Progress Notes (Signed)
CSW attempted to meet with MOB to offer support and complete assessment due to NICU admission, hx of Depression and hx of THC use, but she was not in her room at this time.  CSW will attempt again at a later time. 

## 2015-05-07 NOTE — Progress Notes (Signed)
Post Partum Day 1  Subjective: Patient is up in NICU bonding with baby (secondary to low blood sugars).  She reports a HA that is usually relieved with tylenol.  Otherwise she is tolerating po, ambulating well and eating without difficulty.  Objective: Blood pressure 129/87, pulse 88, temperature 98 F (36.7 C), temperature source Oral, resp. rate 18, height 5\' 1"  (1.549 m), weight 165 lb (74.844 kg), SpO2 98 %, unknown if currently breastfeeding. 129-153/80s-97.  Physical Exam:  General: alert and no distress Lochia: appropriate Uterine Fundus: firm Incision: n/a DVT Evaluation: no calf tenderness   Recent Labs  05/06/15 1154 05/07/15 0616  HGB 13.6 12.8  HCT 38.4 36.6    Assessment/Plan: PPD 1 with elevated BPs again.  PIH w/u was negative while in labor.  Will repeat labs and check straight cath Urine PCR. Cont routine PP care Tylenol or Motrin for HA    LOS: 2 days   Tacia Hindley Y 05/07/2015, 5:45 PM

## 2015-05-08 ENCOUNTER — Inpatient Hospital Stay (HOSPITAL_COMMUNITY): Payer: Self-pay

## 2015-05-08 MED ORDER — IBUPROFEN 600 MG PO TABS: 600.0000 mg | ORAL_TABLET | Freq: Four times a day (QID) | ORAL | Status: DC

## 2015-05-08 MED ORDER — DOCUSATE SODIUM 100 MG PO CAPS: 100.0000 mg | ORAL_CAPSULE | Freq: Two times a day (BID) | ORAL | Status: DC

## 2015-05-08 NOTE — Clinical Social Work Maternal (Signed)
CLINICAL SOCIAL WORK MATERNAL/CHILD NOTE  Patient Details  Name: Lynn Garcia MRN: [id number] Date of Birth: 08/28/1995  Date:  05/08/2015  Clinical Social Worker Initiating Note:  Suleman Gunning E. Brigitte Pulse, [place] Date/ Time Initiated:  05/08/15/0930     Child's Name:  Gardiner Ramus   Legal Guardian:   (Parents: Alesia Banda and Waynetta Pean)   Need for Interpreter:  None   Date of Referral:  05/07/15     Reason for Referral:  Current Substance Use/Substance Use During Pregnancy , Other (Comment) (Hx of Depression, NICU admission)   Referral Source:  [facility]   Address:  [address] [place], [place] [address]  Phone number:  [phone]   Household Members:  Minor Children, Significant Other (FOB's 64 year old also lives in the home.  He has a 21 year old who lives with mother in [place].)   Natural Supports (not living in the home):   (Parents report that they have good supports.)   Professional Supports:     Employment:     Type of Work:  (FOB works for [employer].  MOB plans to look for work "when baby gets a little older.")   Education:      Museum/gallery curator Resources:  Medicaid   Other Resources:      Cultural/Religious Considerations Which May Impact Care: None stated.  Strengths:  Ability to meet basic needs , Home prepared for child , Pediatrician chosen , Compliance with medical plan , Understanding of illness (Pediatric follow up will be at [facility].)   Risk Factors/Current Problems:  None   Cognitive State:  Alert , Linear Thinking , Goal Oriented    Mood/Affect:  Interested , Euthymic , Comfortable , Calm    CSW Assessment: CSW met with parents in MOB's first floor room/140 to introduce services, offer support, and complete assessment due to baby's admission to NICU, and hx of Depression and marijuana use noted in MOB's chart.  Parents were quiet, but pleasant and welcoming of CSW's visit.  They were easily engaged in conversation and appear to  be in good spirits.  MOB provided permission to speak openly with FOB present. MOB reports that baby is doing well, but that "his blood sugars keep going up and down."  She reports that he is feeding all by mouth and that he still has a tube "just in case."  Parents report feeling like they are coping well with baby's NICU admission, although MOB states, "I don't like it."  They state they feel comfortable with his care and that they have been well updated on his condition.  CSW validated feelings of "not liking" the situation and discussed the emotional aspect of having a baby admitted to the NICU.  CSW encouraged parents to remember that his intensive care stay is necessary and temporary and to try to focus on him rather than his surroundings.  Parents are understanding that discharge cannot be determined at this time.  MOB reports that they already have a pediatric appointment for Monday, but that he may still be in the hospital.  CSW advised her to cancel this appointment and informed her that the NICU discharge follow up coordinator will make his first appointment once discharge has been determined.  MOB stated understanding. Parents report that they moved to [place] from [place] during the pregnancy for "more opportunities and a new start."  Parents report liking the area.    CSW inquired about marijuana use.  MOB states she stopped as  soon as she found out she was pregnant and does not have plans to resume use.  CSW inquired as to the reasons behind smoking and MOB laughed and said she didn't know.  She denies use for the purpose of self-medicating and states use was minimal.  CSW informed parents of hospital drug screen policy and that urine was not collected, but that meconium will be tested.  CSW explained mandated reporting for positive screens.  Parents were understanding and not concerned. Parents report that they will have everything necessary for baby before his discharge.  They report that  they still need to purchase a bassinet, but that they have the means to do so.  CSW reviewed SIDS precautions with parents, who state they are aware of this.   CSW inquired about how MOB was feeling emotionally before and during pregnancy.  MOB reports no emotional concerns, however, states she deals with Anxiety at times.  She does not feel that her symptoms interfere with daily life or that she needs treatment at this time.  CSW provided education regarding signs and symptoms of perinatal mood disorders.  Parents were attentive and stated understanding.  CSW stressed the importance of talking with a medical provider should symptoms arise.  Parents agreed.  CSW informed MOB of the Feelings After Birth support group held at the hospital if she is interested at any time.  Parents seemed appreciative of the information and for CSW's concern for their emotional wellbeing.   CSW explained ongoing support services offered by NICU CSW while baby is hospitalized and gave contact information.  CSW will monitor MDS result.  CSW Plan/Description:  Engineer, mining , Psychosocial Support and Ongoing Assessment of Needs    Alphonzo Cruise, [place] 05/08/2015, 1:26 PM

## 2015-05-08 NOTE — Progress Notes (Signed)
Discharge instructions reviewed with pt. All questions answered. Rx given, as well as instructions on use of new medications. Pt states understanding of when to follow up with provider.

## 2015-05-08 NOTE — Discharge Summary (Signed)
[facility] Discharge Summary   Patient Name:   Lynn Garcia DOB:     23-Jul-1995 MRN:     [id number]  Date of Admission:   05/05/2015 Date of Discharge:  05/08/2015  Admitting diagnosis:    INDUCTION Active Problems:   Oligohydramnios in third trimester   Chronic hypertension   Spontaneous vaginal delivery  Term Pregnancy Delivered and CHTN    Discharge diagnosis:    INDUCTION Active Problems:   Oligohydramnios in third trimester   Chronic hypertension   Spontaneous vaginal delivery  Term Pregnancy Delivered and CHTN                                                                     Post partum procedures: n/a  Type of Delivery:  SVD  Delivering Provider: STALL, RACHEL   Date of Delivery:  05/06/15  Newborn Data:    Live born female  Birth Weight: 5 lb 2.2 oz (2330 g) APGAR: 6, 9  Baby's Name:  Berneice Gandysaiah Baby Feeding:   Bottle Disposition:   NICU  Complications:   None  Hospital course:      Onset of Labor With Vaginal Delivery     20 y.o. yo G1P1001 at 4387w0d was admitted in Latent Labor on 05/05/2015. Patient had an uncomplicated labor course as follows:  Membrane Rupture Time/Date: 9:36 AM ,05/05/2015   Intrapartum Procedures: Episiotomy: None [1]                                         Lacerations:  None [1]  Patient had a delivery of a Viable infant. 05/06/2015  Information for the patient's newborn:  Collene GobbleLewis, Boy Tamma [[id number]][[id number]]  Delivery Method: Vaginal, Spontaneous Delivery (Filed from Delivery Summary)    Pateint had an uncomplicated postpartum course.  She is ambulating, tolerating a regular diet, passing flatus, and urinating well. Patient is discharged home in stable condition on 05/08/2015.    Physical Exam:   Filed Vitals:   05/06/15 2342 05/07/15 0613 05/07/15 0740 05/08/15 0545  BP: 136/72 139/95 129/87 127/69  Pulse: 85 82 88 74  Temp: 98.5 F (36.9 C) 98 F (36.7 C)  98.2 F (36.8 C)  TempSrc: Oral Oral  Oral  Resp:  18 18  18   Height:      Weight:      SpO2:       General: alert, cooperative and no distress Lochia: appropriate Uterine Fundus: firm Incision: Healing well with no significant drainage DVT Evaluation: No evidence of DVT seen on physical exam.  Labs: Lab Results  Component Value Date   WBC 19.4* 05/07/2015   HGB 12.8 05/07/2015   HCT 36.6 05/07/2015   MCV 89.1 05/07/2015   PLT 191 05/07/2015   CMP Latest Ref Rng 05/07/2015  Glucose 65 - 99 mg/dL 78  BUN 6 - 20 mg/dL 10  Creatinine 8.410.44 - 3.241.00 mg/dL 4.010.72  Sodium 027135 - 253145 mmol/L 141  Potassium 3.5 - 5.1 mmol/L 4.1  Chloride 101 - 111 mmol/L 110  CO2 22 - 32 mmol/L 23  Calcium 8.9 - 10.3 mg/dL 6.6(Y8.7(L)  Total Protein 6.5 - 8.1 g/dL 4.0(H6.4(L)  Total Bilirubin 0.3 - 1.2 mg/dL 8.1(X)  Alkaline Phos 38 - 126 U/L 86  AST 15 - 41 U/L 23  ALT 14 - 54 U/L 15    Discharge instruction: per After Visit Summary and "Baby and Me Booklet".  After Visit Meds:    Medication List    TAKE these medications        docusate sodium 100 MG capsule  Commonly known as:  COLACE  Take 1 capsule (100 mg total) by mouth 2 (two) times daily.     ibuprofen 600 MG tablet  Commonly known as:  ADVIL,MOTRIN  Take 1 tablet (600 mg total) by mouth every 6 (six) hours.     prenatal multivitamin Tabs tablet  Take 1 tablet by mouth daily at 12 noon.        Diet: routine diet  Activity: Advance as tolerated. Pelvic rest for 6 weeks.   Outpatient follow up:6 weeks Follow up Appt:No future appointments. smart start nurse to visit on Tuesday for BP check Follow up visit: No Follow-up on file.  Postpartum contraception: Undecided  05/08/2015 Nija Koopman, CNM

## 2015-05-13 ENCOUNTER — Inpatient Hospital Stay (HOSPITAL_COMMUNITY): Admission: AD | Admit: 2015-05-13 | Payer: Self-pay | Source: Ambulatory Visit | Admitting: Obstetrics & Gynecology

## 2016-01-31 NOTE — L&D Delivery Note (Signed)
Patient is a 21 y.o. now O1H0865G2P2002 s/p NSVD at 6132w1d, who was admitted for IOL for chronic HTN. S/p misoprostol and FB, followed by IV Pitocin. AROM at 23:34 with clear fluid.   Delivery Note At 4:28 AM a viable female was delivered via  (Presentation: LOA).  APGAR: , ; weight  pending Placenta status: intact; to L&D Cord: 3-vessel  Anesthesia: Epidural Episiotomy: None Lacerations: None Suture Repair: none Est. Blood Loss (mL): 100  Head delivered LOA. No nuchal cord present. Shoulder and body delivered in usual fashion. Infant with spontaneous cry, placed on mother's abdomen, dried and bulb suctioned. Cord clamped x 2 after 1-minute delay, and cut by family member. Cord blood drawn. Placenta delivered spontaneously with gentle cord traction. Fundus firm with massage and Pitocin. Perineum inspected and found to have no lacerations.  Mom to postpartum.  Baby to Couplet care / Skin to Skin.  Raynelle FanningJulie P. Brindle Leyba, MD OB Fellow 11/18/16, 4:50 AM

## 2016-05-15 ENCOUNTER — Encounter (HOSPITAL_COMMUNITY): Payer: Self-pay | Admitting: Emergency Medicine

## 2016-05-15 ENCOUNTER — Emergency Department (HOSPITAL_COMMUNITY)
Admission: EM | Admit: 2016-05-15 | Discharge: 2016-05-15 | Disposition: A | Discharge status: Home / Self Care | Payer: Medicaid Other | Attending: Emergency Medicine | Admitting: Emergency Medicine

## 2016-05-15 DIAGNOSIS — O10011 Pre-existing essential hypertension complicating pregnancy, first trimester: Secondary | ICD-10-CM | POA: Diagnosis not present

## 2016-05-15 DIAGNOSIS — Z79899 Other long term (current) drug therapy: Secondary | ICD-10-CM | POA: Insufficient documentation

## 2016-05-15 DIAGNOSIS — O26891 Other specified pregnancy related conditions, first trimester: Secondary | ICD-10-CM | POA: Diagnosis not present

## 2016-05-15 DIAGNOSIS — Z3A01 Less than 8 weeks gestation of pregnancy: Secondary | ICD-10-CM | POA: Insufficient documentation

## 2016-05-15 DIAGNOSIS — R1030 Lower abdominal pain, unspecified: Secondary | ICD-10-CM | POA: Diagnosis not present

## 2016-05-15 DIAGNOSIS — Z3490 Encounter for supervision of normal pregnancy, unspecified, unspecified trimester: Secondary | ICD-10-CM

## 2016-05-15 LAB — URINALYSIS, ROUTINE W REFLEX MICROSCOPIC
Bilirubin Urine: NEGATIVE
Glucose, UA: NEGATIVE mg/dL
Hgb urine dipstick: NEGATIVE
Ketones, ur: NEGATIVE mg/dL
Leukocytes, UA: NEGATIVE
NITRITE: NEGATIVE
PH: 7 (ref 5.0–8.0)
PROTEIN: NEGATIVE mg/dL
SPECIFIC GRAVITY, URINE: 1.017 (ref 1.005–1.030)

## 2016-05-15 LAB — CBC
HEMATOCRIT: 38.6 % (ref 36.0–46.0)
HEMOGLOBIN: 13.4 g/dL (ref 12.0–15.0)
MCH: 30.3 pg (ref 26.0–34.0)
MCHC: 34.7 g/dL (ref 30.0–36.0)
MCV: 87.3 fL (ref 78.0–100.0)
Platelets: 237 10*3/uL (ref 150–400)
RBC: 4.42 MIL/uL (ref 3.87–5.11)
RDW: 12.7 % (ref 11.5–15.5)
WBC: 11.1 10*3/uL — ABNORMAL HIGH (ref 4.0–10.5)

## 2016-05-15 LAB — POC URINE PREG, ED: Preg Test, Ur: POSITIVE — AB

## 2016-05-15 LAB — COMPREHENSIVE METABOLIC PANEL
ALBUMIN: 3.7 g/dL (ref 3.5–5.0)
ALK PHOS: 45 U/L (ref 38–126)
ALT: 15 U/L (ref 14–54)
ANION GAP: 9 (ref 5–15)
AST: 17 U/L (ref 15–41)
BUN: 5 mg/dL — ABNORMAL LOW (ref 6–20)
CALCIUM: 9 mg/dL (ref 8.9–10.3)
CO2: 23 mmol/L (ref 22–32)
Chloride: 104 mmol/L (ref 101–111)
Creatinine, Ser: 0.49 mg/dL (ref 0.44–1.00)
GFR calc non Af Amer: >60 mL/min (ref >60)
GLUCOSE: 88 mg/dL (ref 65–99)
POTASSIUM: 3.8 mmol/L (ref 3.5–5.1)
SODIUM: 136 mmol/L (ref 135–145)
TOTAL PROTEIN: 7 g/dL (ref 6.5–8.1)
Total Bilirubin: 0.2 mg/dL — ABNORMAL LOW (ref 0.3–1.2)

## 2016-05-15 LAB — LIPASE, BLOOD: LIPASE: 15 U/L (ref 11–51)

## 2016-05-15 MED ORDER — PRENATAL VITAMIN 27-0.8 MG PO TABS: 1.0000 | ORAL_TABLET | Freq: Every day | ORAL | 3 refills | Status: DC

## 2016-05-15 MED ORDER — PROMETHAZINE HCL 25 MG PO TABS: 25.0000 mg | ORAL_TABLET | Freq: Four times a day (QID) | ORAL | 2 refills | Status: DC | PRN

## 2016-05-15 NOTE — Discharge Instructions (Signed)
Symptoms are related to pregnancy. Clinically most likely early pregnancy. Make up limited to follow-up with your OB/GYN. Take the prenatal vitamins. Take Phenergan as needed for nausea and vomiting. Return for vaginal bleeding or worse abdominal pain.

## 2016-05-15 NOTE — ED Triage Notes (Signed)
Pt to ER for evaluation of lower abdominal pressure onset one week ago with nausea and vomiting. Pt reports last menstrual cycle was at the beginning of March, denies possibility of pregnancy. Denies vaginal discharge. VSS.

## 2016-05-15 NOTE — ED Provider Notes (Signed)
[facility]-EMERGENCY DEPT Provider Note   CSN: [id number] Arrival date & time: 05/15/16  1117   By signing my name below, I, Clarisse Gouge, attest that this documentation has been prepared under the direction and in the presence of Vanetta Mulders, MD. Electronically signed, Clarisse Gouge, ED Scribe. 05/15/16. 1:43 PM.  History   Chief Complaint Chief Complaint  Patient presents with  . Abdominal Pain   HPI  Lynn Garcia is a 21 y.o. female with h/o HTN, self transported via private vehicle to the Emergency Department with concern for lower abdominal pain x 1 week. She notes associated worsening N/V and headache. Pt describes 8/10, intermittent, "knot"-like bilateral lower abdominal pain that is worsened with eating. No other modifying factors noted. Pt notes baseline irregular menses and "acid reflux". Pt denies vaginal bleeding or discharge, fever, cough, runny nose, sore throat, vomiting blood, SOB, chest pain, dysuria, hematuria, leg swelling, rash, hemophilia, back pain or any other complaints at this time. LNMP around the start of 03/2016. G1/P2/A.  Past Medical History:  Diagnosis Date  . Depression     Patient Active Problem List   Diagnosis Date Noted  . Spontaneous vaginal delivery 05/06/2015  . Oligohydramnios in third trimester 05/05/2015  . Chronic hypertension 05/05/2015    History reviewed. No pertinent surgical history.  OB History    Gravida Para Term Preterm AB Living   SAB TAB Ectopic Multiple Live Births         0 1       Home Medications    Prior to Admission medications   Medication Sig Start Date End Date Taking? Authorizing Provider  docusate sodium (COLACE) 100 MG capsule Take 1 capsule (100 mg total) by mouth 2 (two) times daily. 05/08/15   Venus Standard, CNM  ibuprofen (ADVIL,MOTRIN) 600 MG tablet Take 1 tablet (600 mg total) by mouth every 6 (six) hours. 05/08/15   Venus Standard, CNM  Prenatal Vit-Fe Fumarate-FA (PRENATAL  MULTIVITAMIN) TABS tablet Take 1 tablet by mouth daily at 12 noon.    Historical Provider, MD  Prenatal Vit-Fe Fumarate-FA (PRENATAL VITAMIN) 27-0.8 MG TABS Take 1 tablet by mouth daily. 05/15/16   Vanetta Mulders, MD  promethazine (PHENERGAN) 25 MG tablet Take 1 tablet (25 mg total) by mouth every 6 (six) hours as needed for nausea or vomiting. 05/15/16   Vanetta Mulders, MD    Family History No family history on file.  Social History Social History  Substance Use Topics  . Smoking status: Never Smoker  . Smokeless tobacco: Never Used  . Alcohol use No     Allergies   Patient has no known allergies.   Review of Systems Review of Systems  Constitutional: Negative for chills and fever.  HENT: Negative for congestion, rhinorrhea and sore throat.   Eyes: Negative for visual disturbance.  Respiratory: Negative for cough and shortness of breath.   Cardiovascular: Negative for chest pain and leg swelling.  Gastrointestinal: Positive for abdominal pain, nausea and vomiting. Negative for diarrhea.  Genitourinary: Negative for dysuria and hematuria.  Musculoskeletal: Negative for back pain.  Skin: Negative for rash.  Neurological: Positive for headaches.  Hematological: Does not bruise/bleed easily.  Psychiatric/Behavioral: Negative for confusion.     Physical Exam Updated Vital Signs BP 135/82 (BP Location: Left Arm)   Pulse 74   Temp 98.5 F (36.9 C) (Oral)   Resp 16   Ht 5' (1.524 m)   Wt 160 lb (72.6  kg)   LMP 04/03/2016 (Approximate)   SpO2 100%   Breastfeeding? No   BMI 31.25 kg/m   Physical Exam  Constitutional: She is oriented to person, place, and time. She appears well-developed and well-nourished.  HENT:  Head: Normocephalic and atraumatic.  Mouth/Throat: Oropharynx is clear and moist and mucous membranes are normal.  Eyes: Conjunctivae and EOM are normal. Pupils are equal, round, and reactive to light. Right eye exhibits no discharge. Left eye exhibits no  discharge. No scleral icterus.  Neck: Normal range of motion. No JVD present. No tracheal deviation present.  Cardiovascular: Normal rate, regular rhythm and normal heart sounds.   Pulmonary/Chest: Effort normal and breath sounds normal. No stridor.  Abdominal: Soft. Bowel sounds are normal. There is no tenderness.  Genitourinary: Uterus normal.  Musculoskeletal:  Negative ankle swelling  Neurological: She is alert and oriented to person, place, and time. Coordination normal.  Psychiatric: She has a normal mood and affect. Her behavior is normal. Judgment and thought content normal.  Nursing note and vitals reviewed.    ED Treatments / Results  DIAGNOSTIC STUDIES: Oxygen Saturation is 100% on RA, NL by my interpretation.    COORDINATION OF CARE: 12:57 PM Discussed treatment plan with pt at bedside and pt agreed to plan. Will Rx medications. Pt prepared for d/c, advised of symptomatic care at home and return precautions.   Labs (all labs ordered are listed, but only abnormal results are displayed) Labs Reviewed  COMPREHENSIVE METABOLIC PANEL - Abnormal; Notable for the following:       Result Value   BUN 5 (*)    Total Bilirubin 0.2 (*)    All other components within normal limits  CBC - Abnormal; Notable for the following:    WBC 11.1 (*)    All other components within normal limits  POC URINE PREG, ED - Abnormal; Notable for the following:    Preg Test, Ur POSITIVE (*)    All other components within normal limits  LIPASE, BLOOD  URINALYSIS, ROUTINE W REFLEX MICROSCOPIC    EKG  EKG Interpretation None       Radiology No results found.  Procedures Procedures (including critical care time)  Medications Ordered in ED Medications - No data to display   Initial Impression / Assessment and Plan / ED Course  I have reviewed the triage vital signs and the nursing notes.  Pertinent labs & imaging results that were available during my care of the patient were reviewed  by me and considered in my medical decision making (see chart for details).    Patient with positive pregnancy test. Last menstrual period beginning of March. Periods have been irregular. Patient's abdomen without any acute surgical findings. Clinically no concern for ectopic pregnancy at this time. No vaginal bleeding. [facility] is followed [facility] can have follow-up with them. Patient started on Phenergan for the nausea and vomiting and prenatal vitamins.    Final Clinical Impressions(s) / ED D problem going toiagnoses   Final diagnoses:  Pregnancy, unspecified gestational age    [place] Prescriptions New Prescriptions   PRENATAL VIT-FE FUMARATE-FA (PRENATAL VITAMIN) 27-0.8 MG TABS    Take 1 tablet by mouth daily.   PROMETHAZINE (PHENERGAN) 25 MG TABLET    Take 1 tablet (25 mg total) by mouth every 6 (six) hours as needed for nausea or vomiting.    I personally performed the services described in this documentation, which was scribed in my presence. The recorded information has been reviewed  and is accurate.      Vanetta Mulders, MD 05/15/16 1346

## 2016-05-27 ENCOUNTER — Emergency Department (HOSPITAL_COMMUNITY)
Admission: EM | Admit: 2016-05-27 | Discharge: 2016-05-27 | Disposition: A | Discharge status: Home / Self Care | Payer: Medicaid Other | Attending: Emergency Medicine | Admitting: Emergency Medicine

## 2016-05-27 ENCOUNTER — Emergency Department (HOSPITAL_COMMUNITY): Payer: Medicaid Other

## 2016-05-27 ENCOUNTER — Encounter (HOSPITAL_COMMUNITY): Payer: Self-pay | Admitting: Emergency Medicine

## 2016-05-27 DIAGNOSIS — N760 Acute vaginitis: Secondary | ICD-10-CM

## 2016-05-27 DIAGNOSIS — O23592 Infection of other part of genital tract in pregnancy, second trimester: Secondary | ICD-10-CM | POA: Insufficient documentation

## 2016-05-27 DIAGNOSIS — Z3A01 Less than 8 weeks gestation of pregnancy: Secondary | ICD-10-CM | POA: Insufficient documentation

## 2016-05-27 DIAGNOSIS — R112 Nausea with vomiting, unspecified: Secondary | ICD-10-CM | POA: Insufficient documentation

## 2016-05-27 DIAGNOSIS — O219 Vomiting of pregnancy, unspecified: Secondary | ICD-10-CM

## 2016-05-27 DIAGNOSIS — R102 Pelvic and perineal pain: Secondary | ICD-10-CM

## 2016-05-27 DIAGNOSIS — O26892 Other specified pregnancy related conditions, second trimester: Secondary | ICD-10-CM

## 2016-05-27 DIAGNOSIS — B9689 Other specified bacterial agents as the cause of diseases classified elsewhere: Secondary | ICD-10-CM

## 2016-05-27 DIAGNOSIS — O0281 Inappropriate change in quantitative human chorionic gonadotropin (hCG) in early pregnancy: Secondary | ICD-10-CM | POA: Insufficient documentation

## 2016-05-27 LAB — COMPREHENSIVE METABOLIC PANEL
ALBUMIN: 3.8 g/dL (ref 3.5–5.0)
ALT: 13 U/L — AB (ref 14–54)
ANION GAP: 11 (ref 5–15)
AST: 16 U/L (ref 15–41)
Alkaline Phosphatase: 48 U/L (ref 38–126)
BUN: 5 mg/dL — ABNORMAL LOW (ref 6–20)
CHLORIDE: 105 mmol/L (ref 101–111)
CO2: 20 mmol/L — AB (ref 22–32)
CREATININE: 0.54 mg/dL (ref 0.44–1.00)
Calcium: 9 mg/dL (ref 8.9–10.3)
GFR calc non Af Amer: >60 mL/min (ref >60)
GLUCOSE: 85 mg/dL (ref 65–99)
Potassium: 3.5 mmol/L (ref 3.5–5.1)
SODIUM: 136 mmol/L (ref 135–145)
Total Bilirubin: 0.5 mg/dL (ref 0.3–1.2)
Total Protein: 7.1 g/dL (ref 6.5–8.1)

## 2016-05-27 LAB — URINALYSIS, ROUTINE W REFLEX MICROSCOPIC
Bilirubin Urine: NEGATIVE
Glucose, UA: NEGATIVE mg/dL
Hgb urine dipstick: NEGATIVE
KETONES UR: 5 mg/dL — AB
Leukocytes, UA: NEGATIVE
Nitrite: NEGATIVE
Protein, ur: NEGATIVE mg/dL
Specific Gravity, Urine: 1.016 (ref 1.005–1.030)
pH: 6 (ref 5.0–8.0)

## 2016-05-27 LAB — HCG, QUANTITATIVE, PREGNANCY: hCG, Beta Chain, Quant, S: 84450 m[IU]/mL — ABNORMAL HIGH (ref <5)

## 2016-05-27 LAB — WET PREP, GENITAL
Sperm: NONE SEEN
Trich, Wet Prep: NONE SEEN
YEAST WET PREP: NONE SEEN

## 2016-05-27 LAB — CBC
HCT: 39.2 % (ref 36.0–46.0)
HEMOGLOBIN: 13.5 g/dL (ref 12.0–15.0)
MCH: 29.7 pg (ref 26.0–34.0)
MCHC: 34.4 g/dL (ref 30.0–36.0)
MCV: 86.3 fL (ref 78.0–100.0)
Platelets: 253 10*3/uL (ref 150–400)
RBC: 4.54 MIL/uL (ref 3.87–5.11)
RDW: 12.6 % (ref 11.5–15.5)
WBC: 11.3 10*3/uL — ABNORMAL HIGH (ref 4.0–10.5)

## 2016-05-27 LAB — LIPASE, BLOOD: LIPASE: 17 U/L (ref 11–51)

## 2016-05-27 MED ORDER — ACETAMINOPHEN 325 MG PO TABS
650.0000 mg | ORAL_TABLET | Freq: Once | ORAL | Status: AC
  Administered 2016-05-27: 650 mg via ORAL
  Filled 2016-05-27: qty 2

## 2016-05-27 MED ORDER — METRONIDAZOLE 500 MG PO TABS: 500.0000 mg | ORAL_TABLET | Freq: Two times a day (BID) | ORAL | 0 refills | Status: DC

## 2016-05-27 NOTE — ED Provider Notes (Signed)
[facility]-EMERGENCY DEPT Provider Note   CSN: [id number] Arrival date & time: 05/27/16  1245     History   Chief Complaint Chief Complaint  Patient presents with  . Abdominal Pain  . Possible Pregnancy    HPI Judyth Demarais is a 21 y.o. G65P1001 female currently pregnant at unknown EGA, with a PMHx of depression and HTN, who presents to the ED with complaints of ongoing RLQ pain 2 weeks. Chart review reveals she was seen here 2wks ago for RLQ pain and n/v as well as HAs, had labs that were unremarkable aside from +UPreg, no imaging or further work up was done, started on prenatals and phenergan and told to f/up for ongoing prenatal care. She states the Phenergan has helped with her nausea and this is not ongoing at this time. However she continues to complain of RLQ pain that has persisted and today is slightly worse. She describes the pain as 8/10 constant crampy nonradiating RLQ pain worse with standing and mildly improved with Tylenol. She is sexually active with one female partner, unprotected. She has an OB/GYN appointment with [facility] on 06/14/16 to establish prenatal care. Menstrual cycles are irregular, she had a typical menses on 02/19/16 then missed her February menses which is normal for her, and then had some milder spotting on 04/24/16 which was less than a normal menses. She denies hx of ectopic or any other issues in pregnancy, but she reports that she had pre-eclampsia late in her prior pregnancy; delivered that pregnancy with no complications at 39wks via SVD.   She denies fevers, chills, CP, SOB, ongoing nausea/vomiting, diarrhea/constipation, obstipation, melena, hematochezia, hematuria, vaginal bleeding/discharge, dysuria, myalgias, arthralgias, numbness, tingling, focal weakness, or any other complaints at this time. Denies recent travel, sick contacts, suspicious food intake, EtOH use, NSAID use, or prior abd surgeries.    The history is provided by the patient and  medical records. No language interpreter was used.  Abdominal Pain   This is a recurrent problem. The current episode started more than 1 week ago. The problem occurs constantly. The problem has not changed since onset.Associated with: pregnancy. The pain is located in the RLQ. The quality of the pain is cramping and pressure-like. The pain is at a severity of 8/10. The pain is moderate. Associated symptoms include nausea (none ongoing). Pertinent negatives include fever, diarrhea, flatus, hematochezia, melena, vomiting, constipation, dysuria, hematuria, arthralgias and myalgias. The symptoms are aggravated by activity. The symptoms are relieved by acetaminophen.  Possible Pregnancy  Associated symptoms include abdominal pain. Pertinent negatives include no chest pain and no shortness of breath.    Past Medical History:  Diagnosis Date  . Depression     Patient Active Problem List   Diagnosis Date Noted  . Spontaneous vaginal delivery 05/06/2015  . Oligohydramnios in third trimester 05/05/2015  . Chronic hypertension 05/05/2015    History reviewed. No pertinent surgical history.  OB History    Gravida Para Term Preterm AB Living   SAB TAB Ectopic Multiple Live Births         0 1       Home Medications    Prior to Admission medications   Medication Sig Start Date End Date Taking? Authorizing Provider  docusate sodium (COLACE) 100 MG capsule Take 1 capsule (100 mg total) by mouth 2 (two) times daily. 05/08/15   Venus Standard, CNM  ibuprofen (ADVIL,MOTRIN) 600 MG tablet Take 1 tablet (600 mg  total) by mouth every 6 (six) hours. 05/08/15   Venus Standard, CNM  Prenatal Vit-Fe Fumarate-FA (PRENATAL MULTIVITAMIN) TABS tablet Take 1 tablet by mouth daily at 12 noon.    Historical Provider, MD  Prenatal Vit-Fe Fumarate-FA (PRENATAL VITAMIN) 27-0.8 MG TABS Take 1 tablet by mouth daily. 05/15/16   Vanetta Mulders, MD  promethazine (PHENERGAN) 25 MG tablet Take 1 tablet (25 mg  total) by mouth every 6 (six) hours as needed for nausea or vomiting. 05/15/16   Vanetta Mulders, MD    Family History No family history on file.  Social History Social History  Substance Use Topics  . Smoking status: Never Smoker  . Smokeless tobacco: Never Used  . Alcohol use No     Allergies   Patient has no known allergies.   Review of Systems Review of Systems  Constitutional: Negative for chills and fever.  Respiratory: Negative for shortness of breath.   Cardiovascular: Negative for chest pain.  Gastrointestinal: Positive for abdominal pain and nausea (none ongoing). Negative for blood in stool, constipation, diarrhea, flatus, hematochezia, melena and vomiting.  Genitourinary: Negative for dysuria, hematuria, vaginal bleeding and vaginal discharge.  Musculoskeletal: Negative for arthralgias and myalgias.  Skin: Negative for color change.  Allergic/Immunologic: Negative for immunocompromised state.  Neurological: Negative for weakness and numbness.  Psychiatric/Behavioral: Negative for confusion.   All other systems reviewed and are negative for acute change except as noted in the HPI.    Physical Exam Updated Vital Signs BP 135/88   Pulse 85   Temp 98.1 F (36.7 C) (Oral)   Resp 16   Ht  (1.549 m)   Wt 72.6 kg   LMP  (LMP Unknown)   SpO2 99%   BMI 30.23 kg/m   Physical Exam  Constitutional: She is oriented to person, place, and time. Vital signs are normal. She appears well-developed and well-nourished.  Non-toxic appearance. No distress.  Afebrile, nontoxic, NAD  HENT:  Head: Normocephalic and atraumatic.  Mouth/Throat: Oropharynx is clear and moist and mucous membranes are normal.  Eyes: Conjunctivae and EOM are normal. Right eye exhibits no discharge. Left eye exhibits no discharge.  Neck: Normal range of motion. Neck supple.  Cardiovascular: Normal rate, regular rhythm, normal heart sounds and intact distal pulses.  Exam reveals no gallop and no  friction rub.   No murmur heard. Pulmonary/Chest: Effort normal and breath sounds normal. No respiratory distress. She has no decreased breath sounds. She has no wheezes. She has no rhonchi. She has no rales.  Abdominal: Soft. Normal appearance and bowel sounds are normal. She exhibits no distension. There is tenderness in the right lower quadrant and suprapubic area. There is no rigidity, no rebound, no guarding, no CVA tenderness, no tenderness at McBurney's point and negative Murphy's sign.  Soft, nondistended, +BS throughout, with mild RLQ and suprapubic TTP, no r/g/r, neg murphy's, neg mcburney's, no CVA TTP. ?Gravid uterus palpable to just above level of pubic symphysis  Genitourinary: Pelvic exam was performed with patient supine. There is no rash, tenderness or lesion on the right labia. There is no rash, tenderness or lesion on the left labia. Uterus is enlarged. Uterus is not deviated, not fixed and not tender. Cervix exhibits discharge. Cervix exhibits no motion tenderness and no friability. Right adnexum displays tenderness. Right adnexum displays no mass and no fullness. Left adnexum displays no mass, no tenderness and no fullness. No erythema, tenderness or bleeding in the vagina. Vaginal discharge found.  Genitourinary Comments: Chaperone present for  exam. No rashes, lesions, or tenderness to external genitalia. No erythema, injury, or tenderness to vaginal mucosa. Scant white vaginal discharge without bleeding within vaginal vault. No adnexal masses or fullness, but mild R adnexal TTP. No CMT or cervical friability, with scant white discharge from cervical os. Cervical os is closed. Uterus non-deviated, mobile, nonTTP, and with only slight enlargement ?gravid to just above level of pubic symphysis.    Musculoskeletal: Normal range of motion.  Neurological: She is alert and oriented to person, place, and time. She has normal strength. No sensory deficit.  Skin: Skin is warm, dry and intact.  No rash noted.  Psychiatric: She has a normal mood and affect.  Nursing note and vitals reviewed.    ED Treatments / Results  Labs (all labs ordered are listed, but only abnormal results are displayed) Labs Reviewed  WET PREP, GENITAL - Abnormal; Notable for the following:       Result Value   Clue Cells Wet Prep HPF POC PRESENT (*)    WBC, Wet Prep HPF POC MANY (*)    All other components within normal limits  COMPREHENSIVE METABOLIC PANEL - Abnormal; Notable for the following:    CO2 20 (*)    BUN 5 (*)    ALT 13 (*)    All other components within normal limits  CBC - Abnormal; Notable for the following:    WBC 11.3 (*)    All other components within normal limits  URINALYSIS, ROUTINE W REFLEX MICROSCOPIC - Abnormal; Notable for the following:    Ketones, ur 5 (*)    All other components within normal limits  HCG, QUANTITATIVE, PREGNANCY - Abnormal; Notable for the following:    hCG, Beta Chain, Quant, S 84,450 (*)    All other components within normal limits  LIPASE, BLOOD  RPR  HIV ANTIBODY (ROUTINE TESTING)  GC/CHLAMYDIA PROBE AMP () NOT AT [facility]    EKG  EKG Interpretation None       Radiology US Ob Limited  Result Date: 05/27/2016 CLINICAL DATA:  Right adnexal pain. EXAM: LIMITED OBSTETRIC ULTRASOUND AN DOPPLER FINDINGS: Number of Fetuses: 1 Heart Rate:  140 bpm Movement: Present Presentation: Transverse Placental Location: Anterior Previa: None Amniotic Fluid (Subjective):  Within normal limits. BPD:  2.42cm 14 w  1d MATERNAL FINDINGS: Cervix:  Appears closed. Uterus/Adnexae: No abnormality visualized. Pulsed Doppler evaluation of both ovaries demonstrates normal low-resistance arterial and venous waveforms. Other findings No free fluid. IMPRESSION: Single live intrauterine pregnancy corresponding to 14 weeks and 1 day gestation. Normal appearance of the maternal adnexa. This exam is performed on an emergent basis and does not comprehensively evaluate  fetal size, dating, or anatomy; follow-up complete OB US should be considered if further fetal assessment is warranted. Electronically Signed   By: Ted Mcalpine M.D.   On: 05/27/2016 15:37   [place] Art/ven Flow Abd Pelv Doppler  Result Date: 05/27/2016 CLINICAL DATA:  Right adnexal pain. EXAM: LIMITED OBSTETRIC ULTRASOUND AN DOPPLER FINDINGS: Number of Fetuses: 1 Heart Rate:  140 bpm Movement: Present Presentation: Transverse Placental Location: Anterior Previa: None Amniotic Fluid (Subjective):  Within normal limits. BPD:  2.42cm 14 w  1d MATERNAL FINDINGS: Cervix:  Appears closed. Uterus/Adnexae: No abnormality visualized. Pulsed Doppler evaluation of both ovaries demonstrates normal low-resistance arterial and venous waveforms. Other findings No free fluid. IMPRESSION: Single live intrauterine pregnancy corresponding to 14 weeks and 1 day gestation. Normal appearance of the maternal adnexa. This exam is performed on an emergent basis  and does not comprehensively evaluate fetal size, dating, or anatomy; follow-up complete OB US should be considered if further fetal assessment is warranted. Electronically Signed   By: Ted Mcalpine M.D.   On: 05/27/2016 15:37    Procedures Procedures (including critical care time)  Medications Ordered in ED Medications  acetaminophen (TYLENOL) tablet 650 mg (650 mg Oral Given 05/27/16 1534)     Initial Impression / Assessment and Plan / ED Course  I have reviewed the triage vital signs and the nursing notes.  Pertinent labs & imaging results that were available during my care of the patient were reviewed by me and considered in my medical decision making (see chart for details).     21 y.o. female here for RLQ pain x2wks, and currently pregnant, unsure how far along. LMP ~02/19/16 but then spotting on 04/24/16 so either [redacted]w[redacted]d or [redacted]w[redacted]d depending on if march period was a true menstrual cycle. On exam, mild RLQ and suprapubic TTP, nonperitoneal, no  mcburney's point tenderness or r/g/r. Pelvic exam reveals scant white vaginal discharge, mild R adnexal tenderness, no CMT. Uterus feels gravid up to just above pubic symphysis, more consistent with EGA >6-8 wks. Labs reveal: CBC with mildly elevated WBC 11.3 similar to prior values, CMP essentially unremarkable, lipase WNL, U/A with very few ketones but otherwise clear with no evidence of UTI or other concerning findings, no need to send for culture. QuantHCG 84,450. Will send off wet prep and STD panel, and get transvaginal/pelvic U/S to eval for ectopic vs TOA vs torsion, etc. Will give tylenol and reassess shortly. Pt declines needing anything for nausea today.   4:21 PM Pt feeling better. Wet prep with +clue cells, given that she has some discharge on exam, will treat for this. U/S showing single live IUP at ~[redacted]w[redacted]d consistent with the LMP date in January. No other acute or concerning findings on U/S. Will send home with flagyl for BV tx. Advised tylenol use, stay hydrated, get rest, and continue prenatals. F/up with her OBGYN for prenatal care at appt in 2wks, and for recheck of symptoms. Strict return precautions advised. Discussed abstinence until results of STD testing found, but given lack of concerning findings for GC/CT, will not empirically cover today. F/up with health dept for future STD concerns. Safe sex encouraged, and discussed having partners tested and treated before re-engaging in intercourse.  I explained the diagnosis and have given explicit precautions to return to the ER including for any other new or worsening symptoms. The patient understands and accepts the medical plan as it's been dictated and I have answered their questions. Discharge instructions concerning home care and prescriptions have been given. The patient is STABLE and is discharged to home in good condition.    Final Clinical Impressions(s) / ED Diagnoses   Final diagnoses:  Right adnexal tenderness  Pelvic pain in  pregnancy, antepartum, second trimester  Nausea/vomiting in pregnancy  BV (bacterial vaginosis)    New Prescriptions New Prescriptions   METRONIDAZOLE (FLAGYL) 500 MG TABLET    Take 1 tablet (500 mg total) by mouth 2 (two) times daily. One po bid x 7 days     [address], PA-C 05/27/16 1621    Doug Sou, MD 05/27/16 [phone]

## 2016-05-27 NOTE — ED Triage Notes (Signed)
Pt states c/o R sided abdominal pain, states she came two weeks ago and they told her she was pregnant. She is unsure how far along she is. States her last period was march. States she has pressure two weeks ago for her visit but now its painful. Pt in NAD, ambulatory.

## 2016-05-27 NOTE — Discharge Instructions (Signed)
Your vaginal swabs showed bacterial vaginosis, take flagyl as directed. You may use tylenol as needed for pain, but avoid NSAIDs like ibuprofen/aleve/etc as these are NOT SAFE in pregnancy. Stay well hydrated, continue taking your prenatal vitamins, and continue using phenergan as needed for nausea. You have been tested for gonorrhea, chlamydia, HIV, and Syphilis, and the hospital will call you if the lab is positive. DO NOT ENGAGE IN SEXUAL ACTIVITY UNTIL YOU FIND OUT ABOUT YOUR RESULTS AND HAVE PARTNERS TESTED AND TREATED. ALL PARTNERS MUST BE TESTED AND TREATED FOR STD'S. ALWAYS USE CONDOMS WHEN ENGAGING IN INTERCOURSE. [facility] for [facility]D concerns or screenings.  Follow up with your regular OBGYN at your already scheduled appointment on 06/14/16 for recheck of symptoms and ongoing prenatal care. Go to the [facility] hospital emergency department (called the [facility]) for changes or worsening symptoms.

## 2016-05-28 LAB — HIV ANTIBODY (ROUTINE TESTING W REFLEX): HIV Screen 4th Generation wRfx: NONREACTIVE

## 2016-05-28 LAB — RPR: RPR Ser Ql: NONREACTIVE

## 2016-05-29 LAB — GC/CHLAMYDIA PROBE AMP (~~LOC~~) NOT AT ARMC
Chlamydia: NEGATIVE
NEISSERIA GONORRHEA: NEGATIVE

## 2016-06-14 ENCOUNTER — Ambulatory Visit (INDEPENDENT_AMBULATORY_CARE_PROVIDER_SITE_OTHER): Payer: Medicaid Other | Admitting: Certified Nurse Midwife

## 2016-06-14 ENCOUNTER — Encounter: Payer: Self-pay | Admitting: Certified Nurse Midwife

## 2016-06-14 ENCOUNTER — Other Ambulatory Visit (HOSPITAL_COMMUNITY)
Admission: RE | Admit: 2016-06-14 | Discharge: 2016-06-14 | Disposition: A | Discharge status: Home / Self Care | Payer: Medicaid Other | Source: Ambulatory Visit | Attending: Certified Nurse Midwife | Admitting: Certified Nurse Midwife

## 2016-06-14 VITALS — BP 114/76 | HR 92 | Wt 142.0 lb

## 2016-06-14 DIAGNOSIS — Z3A16 16 weeks gestation of pregnancy: Secondary | ICD-10-CM | POA: Insufficient documentation

## 2016-06-14 DIAGNOSIS — Z3482 Encounter for supervision of other normal pregnancy, second trimester: Secondary | ICD-10-CM

## 2016-06-14 DIAGNOSIS — O219 Vomiting of pregnancy, unspecified: Secondary | ICD-10-CM | POA: Diagnosis not present

## 2016-06-14 DIAGNOSIS — O0992 Supervision of high risk pregnancy, unspecified, second trimester: Secondary | ICD-10-CM | POA: Insufficient documentation

## 2016-06-14 DIAGNOSIS — O0990 Supervision of high risk pregnancy, unspecified, unspecified trimester: Secondary | ICD-10-CM | POA: Diagnosis present

## 2016-06-14 DIAGNOSIS — I10 Essential (primary) hypertension: Secondary | ICD-10-CM

## 2016-06-14 DIAGNOSIS — Z3480 Encounter for supervision of other normal pregnancy, unspecified trimester: Secondary | ICD-10-CM | POA: Diagnosis not present

## 2016-06-14 DIAGNOSIS — O10912 Unspecified pre-existing hypertension complicating pregnancy, second trimester: Secondary | ICD-10-CM

## 2016-06-14 MED ORDER — DOXYLAMINE-PYRIDOXINE ER 20-20 MG PO TBCR: 1.0000 | EXTENDED_RELEASE_TABLET | Freq: Two times a day (BID) | ORAL | 6 refills | Status: DC

## 2016-06-14 MED ORDER — ASPIRIN 81 MG PO CHEW: 81.0000 mg | CHEWABLE_TABLET | Freq: Every day | ORAL | 12 refills | Status: DC

## 2016-06-14 MED ORDER — PRENATE PIXIE 10-0.6-0.4-200 MG PO CAPS: 1.0000 | ORAL_CAPSULE | Freq: Every day | ORAL | 12 refills | Status: DC

## 2016-06-14 NOTE — Progress Notes (Signed)
Subjective:    Lynn Garcia is being seen today for her first obstetrical visit.  This is not a planned pregnancy. She is at 64w5dgestation. Her obstetrical history is significant for pre-eclampsia ,chtn,depression and anxiety and late to pnc. Patient reports her last infant had a 7 day NICU due to low BS after delivery. Patient denies any current symptoms of depression or anxiety , took Vyvance,a few years ago. Patient admits to nausea that she was taking phenergan for but desires something else because her feel "funny". Patient Relationship with FOB: significant other, living together. Fob will be involved per patient,not present at today visit. Patient does intend to breast and bottle feed. Pregnancy history fully reviewed.  The information documented in the HPI was reviewed and verified.  Menstrual History:  137OB History    Gravida Para Term Preterm AB Living   _0 SAB TAB Ectopic Multiple Live Births         0 1      Menarche age: 89Patient's last menstrual period was 02/18/2016. t    Past Medical History:  Diagnosis Date   Anxiety   . Depression     History reviewed. No pertinent surgical history.   (Not in a hospital admission) No Known Allergies  Social History  Substance Use Topics  . Smoking status: Former Smoker    Quit date: 03/2016  . Smokeless tobacco: Never Used  . Alcohol use No    Family History  Problem Relation Age of Onset  . Hypertension Mother      Review of Systems Constitutional: negative for weight loss Gastrointestinal: negative for vomiting Genitourinary:negative for genital lesions and vaginal discharge and dysuria Musculoskeletal:negative for back pain Behavioral/Psych: negative for abusive relationship, depression, illegal drug usage and tobacco use    Objective:    BP 114/76   Pulse 92   Wt 142 lb (64.4 kg)   LMP 02/18/2016   BMI 26.83 kg/m  General Appearance:    Alert, cooperative, no distress, appears stated  age  Head:    Normocephalic, without obvious abnormality, atraumatic  Eyes:    PERRL, conjunctiva/corneas clear, EOM's intact, fundi    benign, both eyes  Ears:    Normal TM's and external ear canals, both ears  Nose:   Nares normal, septum midline, mucosa normal, no drainage    or sinus tenderness  Throat:   Lips, mucosa, and tongue normal; teeth and gums normal  Neck:   Supple, symmetrical, trachea midline, no adenopathy;    thyroid:  no enlargement/tenderness/nodules; no carotid   bruit or JVD  Back:     Symmetric, no curvature, ROM normal, no CVA tenderness  Lungs:     Clear to auscultation bilaterally, respirations unlabored  Chest Wall:    No tenderness or deformity   Heart:    Regular rate and rhythm, S1 and S2 normal, no murmur, rub   or gallop  Breast Exam:    No tenderness, masses, or nipple abnormality  Abdomen:     Soft, non-tender, bowel sounds active all four quadrants,    no masses, no organomegaly  Genitalia:    Normal female without lesion, discharge or tenderness  Extremities:   Extremities normal, atraumatic, no cyanosis or edema  Pulses:   2+ and symmetric all extremities  Skin:   Skin color, texture, turgor normal, no rashes or lesions  Lymph nodes:   Cervical, supraclavicular, and axillary nodes normal  Neurologic:  CNII-XII intact, normal strength, sensation and reflexes    Throughout Cervix/-Closed,thck,intact,no CMT      Lab Review Urine pregnancy test Labs reviewed yes yes Radiologic studies reviewed yes Assessment:    Pregnancy at 11w5dweeks ,FHT142, Uterine Size=Dates   Plan:     Supervision of high Garcia pregnancy, antepartum - Plan: [place] OB DETAIL +14 WK  Supervision of other normal pregnancy, antepartum - Plan: Cytology - PAP, Culture, OB Urine, Varicella zoster antibody, IgG, Hemoglobin A1c, Hemoglobinopathy evaluation, Cervicovaginal ancillary only, Obstetric Panel, Including HIV, Vitamin D (25 hydroxy), MaterniT21 PLUS Core+SCA, Cystic  Fibrosis Mutation 97, AFP, Serum, Open Spina Bifida, Prenat-FeAsp-Meth-FA-DHA w/o A (PRENATE PIXIE) 10-0.6-0.4-200 MG CAPS  Chronic hypertension - Plan: Comp Met (CMET), Protein / creatinine ratio, urine, aspirin 81 MG chewable tablet  Nausea and vomiting in pregnancy prior to [redacted] weeks gestation - Plan: Doxylamine-Pyridoxine ER (BONJESTA) 20-20 MG TBCR    Prenatal vitamins.  Counseling provided regarding continued use of seat belts, cessation of alcohol consumption, smoking or use of illicit drugs; infection precautions i.e., influenza/TDAP immunizations, toxoplasmosis,CMV, parvovirus, listeria and varicella; workplace safety, exercise during pregnancy; routine dental care, safe medications, sexual activity, hot tubs, saunas, pools, travel, caffeine use, fish and methlymercury, potential toxins, hair treatments, varicose veins Weight gain recommendations per IOM guidelines reviewed: underweight/BMI< 18.5--> gain 28 - 40 lbs; normal weight/BMI 18.5 - 24.9--> gain 25 - 35 lbs; overweight/BMI 25 - 29.9--> gain 15 - 25 lbs; obese/BMI >30->gain  11 - 20 lbs Problem list reviewed and updated. FIRST/CF mutation testing/NIPT/QUAD SCREEN/fragile X/Ashkenazi Jewish population testing/Spinal muscular atrophy discussed: ordered. Role of ultrasound in pregnancy discussed; fetal survey: ordered. Amniocentesis discussed: not indicated at this time..   Follow up in 4 weeks. 50% of 30 min visit spent on counseling and coordination of care.

## 2016-06-14 NOTE — Progress Notes (Signed)
Patient is in the office for initial ob visit, denies pain, reports feeling a little fetal movement. 

## 2016-06-15 LAB — PROTEIN / CREATININE RATIO, URINE
Creatinine, Urine: 115.7 mg/dL
Protein, Ur: 29 mg/dL
Protein/Creat Ratio: 251 mg/g{creat} — ABNORMAL HIGH (ref 0–200)

## 2016-06-15 LAB — CERVICOVAGINAL ANCILLARY ONLY
BACTERIAL VAGINITIS: NEGATIVE
CHLAMYDIA, DNA PROBE: NEGATIVE
Candida vaginitis: POSITIVE — AB
NEISSERIA GONORRHEA: NEGATIVE
TRICH (WINDOWPATH): NEGATIVE

## 2016-06-16 LAB — HEMOGLOBIN A1C
ESTIMATED AVERAGE GLUCOSE: 94 mg/dL
Hgb A1c MFr Bld: 4.9 % (ref 4.8–5.6)

## 2016-06-16 LAB — OBSTETRIC PANEL, INCLUDING HIV
Antibody Screen: NEGATIVE
BASOS ABS: 0 10*3/uL (ref 0.0–0.2)
Basos: 0 %
EOS (ABSOLUTE): 0.1 10*3/uL (ref 0.0–0.4)
EOS: 1 %
HIV SCREEN 4TH GENERATION: NONREACTIVE
Hematocrit: 40.2 % (ref 34.0–46.6)
Hemoglobin: 13.4 g/dL (ref 11.1–15.9)
Hepatitis B Surface Ag: NEGATIVE
Immature Grans (Abs): 0.1 10*3/uL (ref 0.0–0.1)
Immature Granulocytes: 1 %
LYMPHS ABS: 2.2 10*3/uL (ref 0.7–3.1)
Lymphs: 21 %
MCH: 30.1 pg (ref 26.6–33.0)
MCHC: 33.3 g/dL (ref 31.5–35.7)
MCV: 90 fL (ref 79–97)
MONOS ABS: 0.4 10*3/uL (ref 0.1–0.9)
Monocytes: 4 %
NEUTROS ABS: 7.6 10*3/uL — AB (ref 1.4–7.0)
NEUTROS PCT: 73 %
Platelets: 248 10*3/uL (ref 150–379)
RBC: 4.45 x10E6/uL (ref 3.77–5.28)
RDW: 14 % (ref 12.3–15.4)
RH TYPE: POSITIVE
RPR Ser Ql: NONREACTIVE
Rubella Antibodies, IGG: 5.11 index (ref >0.99)
WBC: 10.4 10*3/uL (ref 3.4–10.8)

## 2016-06-16 LAB — COMPREHENSIVE METABOLIC PANEL
ALK PHOS: 54 IU/L (ref 39–117)
ALT: 13 IU/L (ref 0–32)
AST: 11 IU/L (ref 0–40)
Albumin/Globulin Ratio: 1.5 (ref 1.2–2.2)
Albumin: 4 g/dL (ref 3.5–5.5)
BUN / CREAT RATIO: 12 (ref 9–23)
BUN: 6 mg/dL (ref 6–20)
CHLORIDE: 99 mmol/L (ref 96–106)
CO2: 18 mmol/L (ref 18–29)
Calcium: 8.9 mg/dL (ref 8.7–10.2)
Creatinine, Ser: 0.49 mg/dL — ABNORMAL LOW (ref 0.57–1.00)
GFR calc Af Amer: 161 mL/min/{1.73_m2} (ref >59)
GFR calc non Af Amer: 140 mL/min/{1.73_m2} (ref >59)
Globulin, Total: 2.6 g/dL (ref 1.5–4.5)
Glucose: 72 mg/dL (ref 65–99)
POTASSIUM: 4.1 mmol/L (ref 3.5–5.2)
Sodium: 137 mmol/L (ref 134–144)
Total Protein: 6.6 g/dL (ref 6.0–8.5)

## 2016-06-16 LAB — CYTOLOGY - PAP

## 2016-06-16 LAB — VITAMIN D 25 HYDROXY (VIT D DEFICIENCY, FRACTURES): Vit D, 25-Hydroxy: 9.8 ng/mL — ABNORMAL LOW (ref 30.0–100.0)

## 2016-06-16 LAB — VARICELLA ZOSTER ANTIBODY, IGG: Varicella zoster IgG: 561 index (ref >165)

## 2016-06-16 LAB — CULTURE, OB URINE

## 2016-06-16 LAB — HEMOGLOBINOPATHY EVALUATION
HGB C: 0 %
HGB S: 0 %
HGB VARIANT: 0 %
Hemoglobin A2 Quantitation: 2.7 % (ref 1.8–3.2)
Hemoglobin F Quantitation: 0 % (ref 0.0–2.0)
Hgb A: 97.3 % (ref 96.4–98.8)

## 2016-06-16 LAB — URINE CULTURE, OB REFLEX

## 2016-06-19 ENCOUNTER — Other Ambulatory Visit: Payer: Self-pay | Admitting: Certified Nurse Midwife

## 2016-06-19 DIAGNOSIS — R7989 Other specified abnormal findings of blood chemistry: Secondary | ICD-10-CM

## 2016-06-19 DIAGNOSIS — O0990 Supervision of high risk pregnancy, unspecified, unspecified trimester: Secondary | ICD-10-CM

## 2016-06-19 LAB — MATERNIT21 PLUS CORE+SCA
Chromosome 13: NEGATIVE
Chromosome 18: NEGATIVE
Chromosome 21: NEGATIVE
Y CHROMOSOME: NOT DETECTED

## 2016-06-19 MED ORDER — VITAMIN D (ERGOCALCIFEROL) 1.25 MG (50000 UNIT) PO CAPS: 50000.0000 [IU] | ORAL_CAPSULE | ORAL | 2 refills | Status: DC

## 2016-06-20 ENCOUNTER — Other Ambulatory Visit: Payer: Self-pay | Admitting: Certified Nurse Midwife

## 2016-06-20 DIAGNOSIS — O0990 Supervision of high risk pregnancy, unspecified, unspecified trimester: Secondary | ICD-10-CM

## 2016-06-20 LAB — AFP, SERUM, OPEN SPINA BIFIDA
AFP MoM: 0.79
AFP VALUE AFPOSL: 31.7 ng/mL
Gest. Age on Collection Date: 16.5 weeks
Maternal Age At EDD: 22.3 yr
OSBR Risk 1 IN: 10000
Test Results:: NEGATIVE
Weight: 142 [lb_av]

## 2016-06-20 LAB — CYSTIC FIBROSIS MUTATION 97: GENE DIS ANAL CARRIER INTERP BLD/T-IMP: NOT DETECTED

## 2016-06-22 ENCOUNTER — Other Ambulatory Visit: Payer: Self-pay | Admitting: Certified Nurse Midwife

## 2016-06-22 DIAGNOSIS — R87622 Low grade squamous intraepithelial lesion on cytologic smear of vagina (LGSIL): Secondary | ICD-10-CM

## 2016-06-22 DIAGNOSIS — O0990 Supervision of high risk pregnancy, unspecified, unspecified trimester: Secondary | ICD-10-CM

## 2016-06-22 DIAGNOSIS — R87612 Low grade squamous intraepithelial lesion on cytologic smear of cervix (LGSIL): Secondary | ICD-10-CM | POA: Insufficient documentation

## 2016-06-30 ENCOUNTER — Ambulatory Visit (HOSPITAL_COMMUNITY)
Admission: RE | Admit: 2016-06-30 | Discharge: 2016-06-30 | Disposition: A | Discharge status: Home / Self Care | Payer: Medicaid Other | Source: Ambulatory Visit | Attending: Certified Nurse Midwife | Admitting: Certified Nurse Midwife

## 2016-06-30 ENCOUNTER — Encounter (HOSPITAL_COMMUNITY): Payer: Self-pay

## 2016-06-30 DIAGNOSIS — O09299 Supervision of pregnancy with other poor reproductive or obstetric history, unspecified trimester: Secondary | ICD-10-CM | POA: Insufficient documentation

## 2016-06-30 DIAGNOSIS — O99332 Smoking (tobacco) complicating pregnancy, second trimester: Secondary | ICD-10-CM | POA: Insufficient documentation

## 2016-06-30 DIAGNOSIS — Z3689 Encounter for other specified antenatal screening: Secondary | ICD-10-CM | POA: Diagnosis not present

## 2016-06-30 DIAGNOSIS — O0992 Supervision of high risk pregnancy, unspecified, second trimester: Secondary | ICD-10-CM | POA: Diagnosis not present

## 2016-06-30 DIAGNOSIS — Z3A19 19 weeks gestation of pregnancy: Secondary | ICD-10-CM | POA: Insufficient documentation

## 2016-06-30 DIAGNOSIS — O10012 Pre-existing essential hypertension complicating pregnancy, second trimester: Secondary | ICD-10-CM | POA: Diagnosis not present

## 2016-06-30 DIAGNOSIS — O0990 Supervision of high risk pregnancy, unspecified, unspecified trimester: Secondary | ICD-10-CM | POA: Diagnosis present

## 2016-06-30 HISTORY — DX: Essential (primary) hypertension: I10

## 2016-07-03 ENCOUNTER — Other Ambulatory Visit: Payer: Self-pay | Admitting: Certified Nurse Midwife

## 2016-07-03 ENCOUNTER — Other Ambulatory Visit (HOSPITAL_COMMUNITY): Payer: Self-pay | Admitting: *Deleted

## 2016-07-03 DIAGNOSIS — IMO0002 Reserved for concepts with insufficient information to code with codable children: Secondary | ICD-10-CM

## 2016-07-03 DIAGNOSIS — O0990 Supervision of high risk pregnancy, unspecified, unspecified trimester: Secondary | ICD-10-CM

## 2016-07-03 DIAGNOSIS — Z0489 Encounter for examination and observation for other specified reasons: Secondary | ICD-10-CM

## 2016-07-03 DIAGNOSIS — I10 Essential (primary) hypertension: Secondary | ICD-10-CM

## 2016-07-12 ENCOUNTER — Ambulatory Visit (INDEPENDENT_AMBULATORY_CARE_PROVIDER_SITE_OTHER): Payer: Medicaid Other | Admitting: Obstetrics & Gynecology

## 2016-07-12 DIAGNOSIS — O10912 Unspecified pre-existing hypertension complicating pregnancy, second trimester: Secondary | ICD-10-CM | POA: Diagnosis not present

## 2016-07-12 DIAGNOSIS — E559 Vitamin D deficiency, unspecified: Secondary | ICD-10-CM

## 2016-07-12 DIAGNOSIS — I10 Essential (primary) hypertension: Secondary | ICD-10-CM

## 2016-07-12 DIAGNOSIS — R7989 Other specified abnormal findings of blood chemistry: Secondary | ICD-10-CM

## 2016-07-12 MED ORDER — VITAMIN D (ERGOCALCIFEROL) 1.25 MG (50000 UNIT) PO CAPS: 50000.0000 [IU] | ORAL_CAPSULE | ORAL | 2 refills | Status: DC

## 2016-07-12 MED ORDER — ASPIRIN 81 MG PO CHEW: 81.0000 mg | CHEWABLE_TABLET | Freq: Every day | ORAL | 12 refills | Status: DC

## 2016-07-12 NOTE — Progress Notes (Signed)
   PRENATAL VISIT NOTE  Subjective:  Lynn Garcia is a 21 y.o. G2P1001 at [redacted]w[redacted]d being seen today for ongoing prenatal care.  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  Patient reports no complaints.  Contractions: Irregular. Vag. Bleeding: None.  Movement: Present. Denies leaking of fluid.   The following portions of the patient's history were reviewed and updated as appropriate: allergies, current medications, past family history, past medical history, past social history, past surgical history and problem list. Problem list updated.  Objective:   Vitals:   07/12/16 0843  BP: 130/83  Pulse: 74  Weight: 147 lb (66.7 kg)    Fetal Status: Fetal Heart Rate (bpm): 135 Fundal Height: 21 cm Movement: Present     General:  Alert, oriented and cooperative. Patient is in no acute distress.  Skin: Skin is warm and dry. No rash noted.   Cardiovascular: Normal heart rate noted  Respiratory: Normal respiratory effort, no problems with respiration noted  Abdomen: Soft, gravid, appropriate for gestational age. Pain/Pressure: Present     Pelvic:  Cervical exam deferred        Extremities: Normal range of motion.  Edema: Trace  Mental Status: Normal mood and affect. Normal behavior. Normal judgment and thought content.   Assessment and Plan:  Pregnancy: G2P1001 at 20w5 - aspirin 81 MG chewable tablet; Cheuppw 1 tablet (81 mg total) by mouth daily.  Dispense: 30 tablet; Refill: 12  2. Low vitamin D level supplement - Vitamin D, Ergocalciferol, (DRISDOL) 50000 units CAPS capsule; Take 1 capsule (50,000 Units total) by mouth every 7 (seven) days.  Dispense: 30 capsule; Refill: 2  Preterm labor symptoms and general obstetric precautions including but not limited to vaginal bleeding,  contractions, leaking of fluid and fetal movement were reviewed in detail with the patient. Please refer to After Visit Summary for other counseling recommendations.  Return in about 4 weeks (around 08/09/2016).   Scheryl DarterJames Lilley Hubble, MD

## 2016-07-12 NOTE — Patient Instructions (Signed)

## 2016-07-28 ENCOUNTER — Ambulatory Visit (HOSPITAL_COMMUNITY)
Admission: RE | Admit: 2016-07-28 | Discharge: 2016-07-28 | Disposition: A | Discharge status: Home / Self Care | Payer: Medicaid Other | Source: Ambulatory Visit | Attending: Certified Nurse Midwife | Admitting: Certified Nurse Midwife

## 2016-07-28 ENCOUNTER — Other Ambulatory Visit (HOSPITAL_COMMUNITY): Payer: Self-pay | Admitting: Maternal and Fetal Medicine

## 2016-07-28 ENCOUNTER — Encounter (HOSPITAL_COMMUNITY): Payer: Self-pay

## 2016-07-28 ENCOUNTER — Other Ambulatory Visit (HOSPITAL_COMMUNITY): Payer: Self-pay | Admitting: *Deleted

## 2016-07-28 DIAGNOSIS — O162 Unspecified maternal hypertension, second trimester: Secondary | ICD-10-CM

## 2016-07-28 DIAGNOSIS — Z362 Encounter for other antenatal screening follow-up: Secondary | ICD-10-CM | POA: Insufficient documentation

## 2016-07-28 DIAGNOSIS — Z0489 Encounter for examination and observation for other specified reasons: Secondary | ICD-10-CM

## 2016-07-28 DIAGNOSIS — O09299 Supervision of pregnancy with other poor reproductive or obstetric history, unspecified trimester: Secondary | ICD-10-CM | POA: Insufficient documentation

## 2016-07-28 DIAGNOSIS — O10919 Unspecified pre-existing hypertension complicating pregnancy, unspecified trimester: Secondary | ICD-10-CM

## 2016-07-28 DIAGNOSIS — O99332 Smoking (tobacco) complicating pregnancy, second trimester: Secondary | ICD-10-CM

## 2016-07-28 DIAGNOSIS — Z8759 Personal history of other complications of pregnancy, childbirth and the puerperium: Secondary | ICD-10-CM

## 2016-07-28 DIAGNOSIS — Z048 Encounter for examination and observation for other specified reasons: Secondary | ICD-10-CM | POA: Diagnosis present

## 2016-07-28 DIAGNOSIS — IMO0002 Reserved for concepts with insufficient information to code with codable children: Secondary | ICD-10-CM

## 2016-07-28 DIAGNOSIS — O09892 Supervision of other high risk pregnancies, second trimester: Secondary | ICD-10-CM

## 2016-07-28 DIAGNOSIS — Z3A23 23 weeks gestation of pregnancy: Secondary | ICD-10-CM | POA: Diagnosis not present

## 2016-08-07 ENCOUNTER — Encounter (HOSPITAL_COMMUNITY): Payer: Self-pay | Admitting: *Deleted

## 2016-08-07 ENCOUNTER — Inpatient Hospital Stay (HOSPITAL_COMMUNITY)
Admission: AD | Admit: 2016-08-07 | Discharge: 2016-08-07 | Disposition: A | Discharge status: Home / Self Care | Payer: Medicaid Other | Source: Ambulatory Visit | Attending: Family Medicine | Admitting: Family Medicine

## 2016-08-07 DIAGNOSIS — Z79899 Other long term (current) drug therapy: Secondary | ICD-10-CM | POA: Diagnosis not present

## 2016-08-07 DIAGNOSIS — Z87891 Personal history of nicotine dependence: Secondary | ICD-10-CM | POA: Diagnosis not present

## 2016-08-07 DIAGNOSIS — Z3A24 24 weeks gestation of pregnancy: Secondary | ICD-10-CM | POA: Diagnosis not present

## 2016-08-07 DIAGNOSIS — R109 Unspecified abdominal pain: Secondary | ICD-10-CM

## 2016-08-07 DIAGNOSIS — O162 Unspecified maternal hypertension, second trimester: Secondary | ICD-10-CM | POA: Insufficient documentation

## 2016-08-07 DIAGNOSIS — O26892 Other specified pregnancy related conditions, second trimester: Secondary | ICD-10-CM | POA: Insufficient documentation

## 2016-08-07 DIAGNOSIS — O99342 Other mental disorders complicating pregnancy, second trimester: Secondary | ICD-10-CM | POA: Insufficient documentation

## 2016-08-07 DIAGNOSIS — O99612 Diseases of the digestive system complicating pregnancy, second trimester: Secondary | ICD-10-CM | POA: Diagnosis not present

## 2016-08-07 DIAGNOSIS — K219 Gastro-esophageal reflux disease without esophagitis: Secondary | ICD-10-CM | POA: Diagnosis not present

## 2016-08-07 DIAGNOSIS — Z7982 Long term (current) use of aspirin: Secondary | ICD-10-CM | POA: Insufficient documentation

## 2016-08-07 DIAGNOSIS — F329 Major depressive disorder, single episode, unspecified: Secondary | ICD-10-CM | POA: Insufficient documentation

## 2016-08-07 DIAGNOSIS — R1011 Right upper quadrant pain: Secondary | ICD-10-CM | POA: Diagnosis present

## 2016-08-07 DIAGNOSIS — R1012 Left upper quadrant pain: Secondary | ICD-10-CM | POA: Insufficient documentation

## 2016-08-07 DIAGNOSIS — F419 Anxiety disorder, unspecified: Secondary | ICD-10-CM | POA: Insufficient documentation

## 2016-08-07 LAB — CBC
HCT: 37.4 % (ref 36.0–46.0)
Hemoglobin: 12.8 g/dL (ref 12.0–15.0)
MCH: 30.5 pg (ref 26.0–34.0)
MCHC: 34.2 g/dL (ref 30.0–36.0)
MCV: 89 fL (ref 78.0–100.0)
Platelets: 203 10*3/uL (ref 150–400)
RBC: 4.2 MIL/uL (ref 3.87–5.11)
RDW: 13.5 % (ref 11.5–15.5)
WBC: 13.8 10*3/uL — ABNORMAL HIGH (ref 4.0–10.5)

## 2016-08-07 LAB — COMPREHENSIVE METABOLIC PANEL
ALBUMIN: 3.3 g/dL — AB (ref 3.5–5.0)
ALT: 11 U/L — ABNORMAL LOW (ref 14–54)
ANION GAP: 6 (ref 5–15)
AST: 14 U/L — ABNORMAL LOW (ref 15–41)
Alkaline Phosphatase: 60 U/L (ref 38–126)
BILIRUBIN TOTAL: 0.3 mg/dL (ref 0.3–1.2)
BUN: 6 mg/dL (ref 6–20)
CO2: 23 mmol/L (ref 22–32)
Calcium: 8.8 mg/dL — ABNORMAL LOW (ref 8.9–10.3)
Chloride: 106 mmol/L (ref 101–111)
Creatinine, Ser: 0.41 mg/dL — ABNORMAL LOW (ref 0.44–1.00)
GFR calc Af Amer: >60 mL/min (ref >60)
GFR calc non Af Amer: >60 mL/min (ref >60)
GLUCOSE: 91 mg/dL (ref 65–99)
POTASSIUM: 3.8 mmol/L (ref 3.5–5.1)
SODIUM: 135 mmol/L (ref 135–145)
TOTAL PROTEIN: 7.2 g/dL (ref 6.5–8.1)

## 2016-08-07 LAB — WET PREP, GENITAL
Clue Cells Wet Prep HPF POC: NONE SEEN
SPERM: NONE SEEN
TRICH WET PREP: NONE SEEN
YEAST WET PREP: NONE SEEN

## 2016-08-07 LAB — URINALYSIS, ROUTINE W REFLEX MICROSCOPIC
Bilirubin Urine: NEGATIVE
Glucose, UA: NEGATIVE mg/dL
Hgb urine dipstick: NEGATIVE
Ketones, ur: NEGATIVE mg/dL
Leukocytes, UA: NEGATIVE
NITRITE: NEGATIVE
Protein, ur: NEGATIVE mg/dL
SPECIFIC GRAVITY, URINE: 1.017 (ref 1.005–1.030)
pH: 6 (ref 5.0–8.0)

## 2016-08-07 MED ORDER — RANITIDINE HCL 150 MG PO TABS: 150.0000 mg | ORAL_TABLET | Freq: Two times a day (BID) | ORAL | 0 refills | Status: DC

## 2016-08-07 NOTE — MAU Note (Signed)
PT SAYS SHE HAS SHARP PAIN IN HER UPPER ABD - STARTED  LAST NIGHT  BUT WORSE NOW.   DID NOT CALL DR.  LAST SEX-  2 WEEKS AGO

## 2016-08-07 NOTE — Discharge Instructions (Signed)

## 2016-08-07 NOTE — MAU Provider Note (Signed)
History     CSN: [id number]  Arrival date and time: 08/07/16 1946   First Provider Initiated Contact with Patient 08/07/16 2030      Chief Complaint  Patient presents with  . Abdominal Pain   Lynn Garcia is a 21 y.o. G2P1001 at 6379w3d with onset last night of sharp  left upper quadrant and right upper quadrant abdominal pains. No pain in lower abdomen or groin. Pain has been intermittent today with several episodes. She does not relate it to moving, position or fetal activity, but says it's improved by staying still. Not related to hunger or eating. Denies nausea, vomiting or constipation. Denies dysuria urgency or frequency of urination. No change in vaginal discharge; no vaginal irritation. She has had no previous similar episodes. No home treatments. No vaginal bleeding, leakage of fluid. Good fetal movement. PNC at [facility]: on BASA for hx HTN. Hx preeclampsia with P1 and then on BP med "a few months" ([facility])    OB History  Gravida Para Term Preterm AB Living  2 1 1     1   SAB TAB Ectopic Multiple Live Births        0 1    # Outcome Date GA Lbr Len/2nd Weight Sex Delivery Anes PTL Lv  2 Current           1 Term 05/06/15 7448w0d 23:06 / 01:56 5 lb 2.2 oz (2.33 kg) M Vag-Spont EPI  LIV     Birth Comments: SGA, IUGR, spent time in NICU    Obstetric Comments  Oligiohydramnios    Past Medical History:  Diagnosis Date  . Anxiety   . Depression   . Hypertension     Past Surgical History:  Procedure Laterality Date  . NO PAST SURGERIES      Family History  Problem Relation Age of Onset  . Hypertension Mother     Social History  Substance Use Topics  . Smoking status: Former Smoker    Quit date: 03/2016  . Smokeless tobacco: Never Used  . Alcohol use No    Allergies: No Known Allergies  Prescriptions Prior to Admission  Medication Sig Dispense Refill Last Dose  . aspirin 81 MG chewable tablet Chew 1 tablet (81 mg total) by mouth daily. 30 tablet 12 08/07/2016 at  Unknown time  . Prenat-FeAsp-Meth-FA-DHA w/o A (PRENATE PIXIE) 10-0.6-0.4-200 MG CAPS Take 1 tablet by mouth daily. 30 capsule 12 Past Week at Unknown time  . Vitamin D, Ergocalciferol, (DRISDOL) 50000 units CAPS capsule Take 1 capsule (50,000 Units total) by mouth every 7 (seven) days. 30 capsule 2 Past Week at Unknown time  . Doxylamine-Pyridoxine ER (BONJESTA) 20-20 MG TBCR Take 1 tablet by mouth 2 (two) times daily. (Patient not taking: Reported on 06/30/2016) 60 tablet 6 Not Taking  . metroNIDAZOLE (FLAGYL) 500 MG tablet Take 1 tablet (500 mg total) by mouth 2 (two) times daily. One po bid x 7 days (Patient not taking: Reported on 06/30/2016) 14 tablet 0 Not Taking  . Prenatal Vit-Fe Fumarate-FA (PRENATAL VITAMIN) 27-0.8 MG TABS Take 1 tablet by mouth daily. (Patient not taking: Reported on 06/30/2016) 30 tablet 3 Not Taking  . promethazine (PHENERGAN) 25 MG tablet Take 1 tablet (25 mg total) by mouth every 6 (six) hours as needed for nausea or vomiting. 20 tablet 2 More than a month at Unknown time    Review of Systems  Constitutional: Negative for fever.  Respiratory: Negative for cough and chest tightness.   Cardiovascular: Negative for  chest pain.  Genitourinary: Negative for dysuria, flank pain, hematuria, pelvic pain, vaginal bleeding and vaginal discharge.   Physical Exam   Blood pressure 112/76, pulse (!) 119, temperature 98.6 F (37 C), temperature source Oral, resp. rate 20, height 5\' 1"  (1.549 m), weight 151 lb 12 oz (68.8 kg), last menstrual period 02/18/2016, not currently breastfeeding.  Physical Exam  Nursing note and vitals reviewed. Constitutional: She is oriented to person, place, and time. She appears well-developed and well-nourished. No distress.  HENT:  Head: Normocephalic.  Eyes: No scleral icterus.  Neck: Neck supple.  Cardiovascular: Normal rate.   Respiratory: Effort normal.  GI: Soft. There is no tenderness.  Size c/w 24 wks Negative Murphy's sign   Genitourinary: Vagina normal.  Genitourinary Comments: VE: closed/long/hiigh  Musculoskeletal: Normal range of motion.  Neurological: She is alert and oriented to person, place, and time.  Skin: Skin is warm and dry.  Psychiatric: She has a normal mood and affect. Her behavior is normal.    MAU Course  Procedures Fetal monitoring FHR: Baseline 1:30-135, moderate variability, 1010 accelerations, no decelerations Toco: No contractions  Results for orders placed or performed during the hospital encounter of 08/07/16 (from the past 24 hour(s))  Urinalysis, Routine w reflex microscopic     Status: None   Collection Time: 08/07/16  8:09 PM  Result Value Ref Range   Color, Urine YELLOW YELLOW   APPearance CLEAR CLEAR   Specific Gravity, Urine 1.017 1.005 - 1.030   pH 6.0 5.0 - 8.0   Glucose, UA NEGATIVE NEGATIVE mg/dL   Hgb urine dipstick NEGATIVE NEGATIVE   Bilirubin Urine NEGATIVE NEGATIVE   Ketones, ur NEGATIVE NEGATIVE mg/dL   Protein, ur NEGATIVE NEGATIVE mg/dL   Nitrite NEGATIVE NEGATIVE   Leukocytes, UA NEGATIVE NEGATIVE  Wet prep, genital     Status: Abnormal   Collection Time: 08/07/16  8:47 PM  Result Value Ref Range   Yeast Wet Prep HPF POC NONE SEEN NONE SEEN   Trich, Wet Prep NONE SEEN NONE SEEN   Clue Cells Wet Prep HPF POC NONE SEEN NONE SEEN   WBC, Wet Prep HPF POC FEW (A) NONE SEEN   Sperm NONE SEEN    2125: Lab results pending> care assumed by Sharen Counter CNM Assessment and Plan  G2P1001 @ [redacted]w[redacted]d  1. Abdominal pain during pregnancy in second trimester   Fetal well-being by EFM.   Follow-up Information    [facility].   Specialty:  Obstetrics and Gynecology Contact information: [address] [place] [address] [phone]         Danae Orleans, CNM 08/07/2016 9:14 PM  MDM:   Normal CBC, CMP, U/A today. NST appropriate for gestation.  No evidence of preterm labor.  Pain in upper  abdomen most likely GI, acid reflux related to pregnancy. Will treat with Zantac 150 mg BID PRN. Pt to f/u with [facility] as scheduled or return to MAU for emergencies. Pt stable at time of discharge.   A: 1. Abdominal pain during pregnancy in second trimester   2. Gastroesophageal reflux disease without esophagitis     P: DC home with preterm labor precautions   Sharen Counter, CNM 10:22 PM

## 2016-08-07 NOTE — MAU Note (Signed)
Urine in lab 

## 2016-08-07 NOTE — MAU Note (Signed)
Pt reports a "pinching feeling" in abdomen since last night. Pt reports that the pain moves around to different parts of her abdomen. Pt also having sharp R sided pain in her back which started an hour ago. Denies fever or urinary symptoms. Denies leaking or bleeding.

## 2016-08-09 ENCOUNTER — Ambulatory Visit (INDEPENDENT_AMBULATORY_CARE_PROVIDER_SITE_OTHER): Payer: Medicaid Other | Admitting: Obstetrics & Gynecology

## 2016-08-09 DIAGNOSIS — O0992 Supervision of high risk pregnancy, unspecified, second trimester: Secondary | ICD-10-CM

## 2016-08-09 DIAGNOSIS — O0990 Supervision of high risk pregnancy, unspecified, unspecified trimester: Secondary | ICD-10-CM

## 2016-08-09 NOTE — Progress Notes (Signed)
   PRENATAL VISIT NOTE  Subjective:  Lynn Garcia is a 21 y.o. G2P1001 at 2165w5d being seen today for ongoing prenatal care.  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  Patient reports she was seen 7/9 in MAU for abdominal pain and was given Rx Zantac.  Contractions: Not present. Vag. Bleeding: None.  Movement: Absent. Denies leaking of fluid.   The following portions of the patient's history were reviewed and updated as appropriate: allergies, current medications, past family history, past medical history, past social history, past surgical history and problem list. Problem list updated.  Objective:   Vitals:   08/09/16 0920  BP: 110/68  Pulse: 84  Weight: 69.9 kg (154 lb)    Fetal Status: Fetal Heart Rate (bpm): 128   Movement: Absent     General:  Alert, oriented and cooperative. Patient is in no acute distress.  Skin: Skin is warm and dry. No rash noted.   Cardiovascular: Normal heart rate noted  Respiratory: Normal respiratory effort, no problems with respiration noted  Abdomen: Soft, gravid, appropriate for gestational age. Pain/Pressure: Present     Pelvic:  Cervical exam deferred        Extremities: Normal range of motion.  Edema: Trace  Mental Status: Normal mood and affect. Normal behavior. Normal judgment and thought content.   Assessment and Plan:  Pregnancy: G2P1001 at 3465w5d  1. Supervision of high risk pregnancy, antepartum Normal BP, will f/u growth US  Preterm labor symptoms and general obstetric precautions including but not limited to vaginal bleeding, contractions, leaking of fluid and fetal movement were reviewed in detail with the patient. Please refer to After Visit Summary for other counseling recommendations.  Return in about 2 weeks (around  08/23/2016) for 2 hr.   Scheryl DarterJames Jonathandavid Marlett, MD

## 2016-08-09 NOTE — Patient Instructions (Signed)
Third Trimester of Pregnancy The third trimester is from week 28 through week 40 (months 7 through 9). The third trimester is a time when the unborn baby (fetus) is growing rapidly. At the end of the ninth month, the fetus is about 20 inches in length and weighs 6-10 pounds. Body changes during your third trimester Your body will continue to go through many changes during pregnancy. The changes vary from woman to woman. During the third trimester:  Your weight will continue to increase. You can expect to gain 25-35 pounds (11-16 kg) by the end of the pregnancy.  You may begin to get stretch marks on your hips, abdomen, and breasts.  You may urinate more often because the fetus is moving lower into your pelvis and pressing on your bladder.  You may develop or continue to have heartburn. This is caused by increased hormones that slow down muscles in the digestive tract.  You may develop or continue to have constipation because increased hormones slow digestion and cause the muscles that push waste through your intestines to relax.  You may develop hemorrhoids. These are swollen veins (varicose veins) in the rectum that can itch or be painful.  You may develop swollen, bulging veins (varicose veins) in your legs.  You may have increased body aches in the pelvis, back, or thighs. This is due to weight gain and increased hormones that are relaxing your joints.  You may have changes in your hair. These can include thickening of your hair, rapid growth, and changes in texture. Some women also have hair loss during or after pregnancy, or hair that feels dry or thin. Your hair will most likely return to normal after your baby is born.  Your breasts will continue to grow and they will continue to become tender. A yellow fluid (colostrum) may leak from your breasts. This is the first milk you are producing for your baby.  Your belly button may stick out.  You may notice more swelling in your hands,  face, or ankles.  You may have increased tingling or numbness in your hands, arms, and legs. The skin on your belly may also feel numb.  You may feel short of breath because of your expanding uterus.  You may have more problems sleeping. This can be caused by the size of your belly, increased need to urinate, and an increase in your body's metabolism.  You may notice the fetus "dropping," or moving lower in your abdomen (lightening).  You may have increased vaginal discharge.  You may notice your joints feel loose and you may have pain around your pelvic bone.  What to expect at prenatal visits You will have prenatal exams every 2 weeks until week 36. Then you will have weekly prenatal exams. During a routine prenatal visit:  You will be weighed to make sure you and the baby are growing normally.  Your blood pressure will be taken.  Your abdomen will be measured to track your baby's growth.  The fetal heartbeat will be listened to.  Any test results from the previous visit will be discussed.  You may have a cervical check near your due date to see if your cervix has softened or thinned (effaced).  You will be tested for Group B streptococcus. This happens between 35 and 37 weeks.  Your health care provider may ask you:  What your birth plan is.  How you are feeling.  If you are feeling the baby move.  If you have had   any abnormal symptoms, such as leaking fluid, bleeding, severe headaches, or abdominal cramping.  If you are using any tobacco products, including cigarettes, chewing tobacco, and electronic cigarettes.  If you have any questions.  Other tests or screenings that may be performed during your third trimester include:  Blood tests that check for low iron levels (anemia).  Fetal testing to check the health, activity level, and growth of the fetus. Testing is done if you have certain medical conditions or if there are problems during the  pregnancy.  Nonstress test (NST). This test checks the health of your baby to make sure there are no signs of problems, such as the baby not getting enough oxygen. During this test, a belt is placed around your belly. The baby is made to move, and its heart rate is monitored during movement.  What is false labor? False labor is a condition in which you feel small, irregular tightenings of the muscles in the womb (contractions) that usually go away with rest, changing position, or drinking water. These are called Braxton Hicks contractions. Contractions may last for hours, days, or even weeks before true labor sets in. If contractions come at regular intervals, become more frequent, increase in intensity, or become painful, you should see your health care provider. What are the signs of labor?  Abdominal cramps.  Regular contractions that start at 10 minutes apart and become stronger and more frequent with time.  Contractions that start on the top of the uterus and spread down to the lower abdomen and back.  Increased pelvic pressure and dull back pain.  A watery or bloody mucus discharge that comes from the vagina.  Leaking of amniotic fluid. This is also known as your "water breaking." It could be a slow trickle or a gush. Let your health care provider know if it has a color or strange odor. If you have any of these signs, call your health care provider right away, even if it is before your due date. Follow these instructions at home: Medicines  Follow your health care provider's instructions regarding medicine use. Specific medicines may be either safe or unsafe to take during pregnancy.  Take a prenatal vitamin that contains at least 600 micrograms (mcg) of folic acid.  If you develop constipation, try taking a stool softener if your health care provider approves. Eating and drinking  Eat a balanced diet that includes fresh fruits and vegetables, whole grains, good sources of protein  such as meat, eggs, or tofu, and low-fat dairy. Your health care provider will help you determine the amount of weight gain that is right for you.  Avoid raw meat and uncooked cheese. These carry germs that can cause birth defects in the baby.  If you have low calcium intake from food, talk to your health care provider about whether you should take a daily calcium supplement.  Eat four or five small meals rather than three large meals a day.  Limit foods that are high in fat and processed sugars, such as fried and sweet foods.  To prevent constipation: ? Drink enough fluid to keep your urine clear or pale yellow. ? Eat foods that are high in fiber, such as fresh fruits and vegetables, whole grains, and beans. Activity  Exercise only as directed by your health care provider. Most women can continue their usual exercise routine during pregnancy. Try to exercise for 30 minutes at least 5 days a week. Stop exercising if you experience uterine contractions.  Avoid heavy   lifting.  Do not exercise in extreme heat or humidity, or at high altitudes.  Wear low-heel, comfortable shoes.  Practice good posture.  You may continue to have sex unless your health care provider tells you otherwise. Relieving pain and discomfort  Take frequent breaks and rest with your legs elevated if you have leg cramps or low back pain.  Take warm sitz baths to soothe any pain or discomfort caused by hemorrhoids. Use hemorrhoid cream if your health care provider approves.  Wear a good support bra to prevent discomfort from breast tenderness.  If you develop varicose veins: ? Wear support pantyhose or compression stockings as told by your healthcare provider. ? Elevate your feet for 15 minutes, 3-4 times a day. Prenatal care  Write down your questions. Take them to your prenatal visits.  Keep all your prenatal visits as told by your health care provider. This is important. Safety  Wear your seat belt at  all times when driving.  Make a list of emergency phone numbers, including numbers for family, friends, the hospital, and police and fire departments. General instructions  Avoid cat litter boxes and soil used by cats. These carry germs that can cause birth defects in the baby. If you have a cat, ask someone to clean the litter box for you.  Do not travel far distances unless it is absolutely necessary and only with the approval of your health care provider.  Do not use hot tubs, steam rooms, or saunas.  Do not drink alcohol.  Do not use any products that contain nicotine or tobacco, such as cigarettes and e-cigarettes. If you need help quitting, ask your health care provider.  Do not use any medicinal herbs or unprescribed drugs. These chemicals affect the formation and growth of the baby.  Do not douche or use tampons or scented sanitary pads.  Do not cross your legs for long periods of time.  To prepare for the arrival of your baby: ? Take prenatal classes to understand, practice, and ask questions about labor and delivery. ? Make a trial run to the hospital. ? Visit the hospital and tour the maternity area. ? Arrange for maternity or paternity leave through employers. ? Arrange for family and friends to take care of pets while you are in the hospital. ? Purchase a rear-facing car seat and make sure you know how to install it in your car. ? Pack your hospital bag. ? Prepare the baby's nursery. Make sure to remove all pillows and stuffed animals from the baby's crib to prevent suffocation.  Visit your dentist if you have not gone during your pregnancy. Use a soft toothbrush to brush your teeth and be gentle when you floss. Contact a health care provider if:  You are unsure if you are in labor or if your water has broken.  You become dizzy.  You have mild pelvic cramps, pelvic pressure, or nagging pain in your abdominal area.  You have lower back pain.  You have persistent  nausea, vomiting, or diarrhea.  You have an unusual or bad smelling vaginal discharge.  You have pain when you urinate. Get help right away if:  Your water breaks before 37 weeks.  You have regular contractions less than 5 minutes apart before 37 weeks.  You have a fever.  You are leaking fluid from your vagina.  You have spotting or bleeding from your vagina.  You have severe abdominal pain or cramping.  You have rapid weight loss or weight gain.    You have shortness of breath with chest pain.  You notice sudden or extreme swelling of your face, hands, ankles, feet, or legs.  Your baby makes fewer than 10 movements in 2 hours.  You have severe headaches that do not go away when you take medicine.  You have vision changes. Summary  The third trimester is from week 28 through week 40, months 7 through 9. The third trimester is a time when the unborn baby (fetus) is growing rapidly.  During the third trimester, your discomfort may increase as you and your baby continue to gain weight. You may have abdominal, leg, and back pain, sleeping problems, and an increased need to urinate.  During the third trimester your breasts will keep growing and they will continue to become tender. A yellow fluid (colostrum) may leak from your breasts. This is the first milk you are producing for your baby.  False labor is a condition in which you feel small, irregular tightenings of the muscles in the womb (contractions) that eventually go away. These are called Braxton Hicks contractions. Contractions may last for hours, days, or even weeks before true labor sets in.  Signs of labor can include: abdominal cramps; regular contractions that start at 10 minutes apart and become stronger and more frequent with time; watery or bloody mucus discharge that comes from the vagina; increased pelvic pressure and dull back pain; and leaking of amniotic fluid. This information is not intended to replace advice  given to you by your health care provider. Make sure you discuss any questions you have with your health care provider. Document Released: 01/10/2001 Document Revised: 06/24/2015 Document Reviewed: 03/19/2012 Elsevier Interactive Patient Education  2017 [employer]  

## 2016-08-18 ENCOUNTER — Other Ambulatory Visit (HOSPITAL_COMMUNITY): Payer: Self-pay | Admitting: Maternal and Fetal Medicine

## 2016-08-18 ENCOUNTER — Other Ambulatory Visit (HOSPITAL_COMMUNITY): Payer: Self-pay | Admitting: *Deleted

## 2016-08-18 ENCOUNTER — Encounter (HOSPITAL_COMMUNITY): Payer: Self-pay

## 2016-08-18 ENCOUNTER — Ambulatory Visit (HOSPITAL_COMMUNITY)
Admission: RE | Admit: 2016-08-18 | Discharge: 2016-08-18 | Disposition: A | Discharge status: Home / Self Care | Payer: Medicaid Other | Source: Ambulatory Visit | Attending: Certified Nurse Midwife | Admitting: Certified Nurse Midwife

## 2016-08-18 DIAGNOSIS — O10012 Pre-existing essential hypertension complicating pregnancy, second trimester: Secondary | ICD-10-CM | POA: Insufficient documentation

## 2016-08-18 DIAGNOSIS — O10919 Unspecified pre-existing hypertension complicating pregnancy, unspecified trimester: Secondary | ICD-10-CM

## 2016-08-18 DIAGNOSIS — Z3A26 26 weeks gestation of pregnancy: Secondary | ICD-10-CM | POA: Diagnosis not present

## 2016-08-18 DIAGNOSIS — O10019 Pre-existing essential hypertension complicating pregnancy, unspecified trimester: Secondary | ICD-10-CM

## 2016-08-18 DIAGNOSIS — O09299 Supervision of pregnancy with other poor reproductive or obstetric history, unspecified trimester: Secondary | ICD-10-CM

## 2016-08-18 DIAGNOSIS — O09292 Supervision of pregnancy with other poor reproductive or obstetric history, second trimester: Secondary | ICD-10-CM | POA: Diagnosis present

## 2016-08-23 ENCOUNTER — Other Ambulatory Visit: Payer: Medicaid Other

## 2016-08-23 ENCOUNTER — Ambulatory Visit (INDEPENDENT_AMBULATORY_CARE_PROVIDER_SITE_OTHER): Payer: Medicaid Other | Admitting: Obstetrics and Gynecology

## 2016-08-23 VITALS — BP 117/66 | HR 76 | Wt 160.0 lb

## 2016-08-23 DIAGNOSIS — Z23 Encounter for immunization: Secondary | ICD-10-CM

## 2016-08-23 DIAGNOSIS — O0990 Supervision of high risk pregnancy, unspecified, unspecified trimester: Secondary | ICD-10-CM

## 2016-08-23 DIAGNOSIS — O10912 Unspecified pre-existing hypertension complicating pregnancy, second trimester: Secondary | ICD-10-CM

## 2016-08-23 DIAGNOSIS — I10 Essential (primary) hypertension: Secondary | ICD-10-CM

## 2016-08-23 DIAGNOSIS — O0992 Supervision of high risk pregnancy, unspecified, second trimester: Secondary | ICD-10-CM

## 2016-08-23 NOTE — Progress Notes (Signed)
Subjective:  Lynn Garcia is a 21 y.o. G2P1001 at 5532w5d being seen today for ongoing prenatal care.  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  Patient reports no complaints.  Contractions: Not present. Vag. Bleeding: None.  Movement: Present. Denies leaking of fluid.   The following portions of the patient's history were reviewed and updated as appropriate: allergies, current medications, past family history, past medical history, past social history, past surgical history and problem list. Problem list updated.  Objective:   Vitals:   08/23/16 0843  BP: 117/66  Pulse: 76  Weight: 160 lb (72.6 kg)    Fetal Status: Fetal Heart Rate (bpm): 138   Movement: Present     General:  Alert, oriented and cooperative. Patient is in no acute distress.  Skin: Skin is warm and dry. No rash noted.   Cardiovascular: Normal heart rate noted  Respiratory: Normal respiratory effort, no problems with respiration noted  Abdomen: Soft, gravid, appropriate for gestational age. Pain/Pressure: Present     Pelvic:  Cervical exam deferred        Extremities: Normal range of motion.  Edema: None  Mental Status: Normal mood and affect. Normal behavior. Normal judgment and thought content.   Urinalysis:      Assessment and Plan:  Pregnancy: G2P1001 at 3232w5d  1. Supervision of high risk pregnancy, antepartum Stable - Glucose Tolerance, 2 Hours w/1 Hour - CBC - HIV antibody - RPR  2. Chronic hypertension BP stable without meds Continue with BASA Start antenatal testing at 32 weeks Growth scan scheduled  Preterm labor symptoms and general obstetric precautions including but not limited to vaginal bleeding, contractions, leaking of fluid and fetal movement were reviewed in detail with the  patient. Please refer to After Visit Summary for other counseling recommendations.  Return in about 3 weeks (around 09/13/2016) for OB visit.   Hermina StaggersErvin, Khameron Gruenwald L, MD

## 2016-08-24 LAB — RPR: RPR: NONREACTIVE

## 2016-08-24 LAB — CBC
Hematocrit: 35.4 % (ref 34.0–46.6)
Hemoglobin: 12.3 g/dL (ref 11.1–15.9)
MCH: 30.2 pg (ref 26.6–33.0)
MCHC: 34.7 g/dL (ref 31.5–35.7)
MCV: 87 fL (ref 79–97)
PLATELETS: 202 10*3/uL (ref 150–379)
RBC: 4.07 x10E6/uL (ref 3.77–5.28)
RDW: 14.3 % (ref 12.3–15.4)
WBC: 12.1 10*3/uL — AB (ref 3.4–10.8)

## 2016-08-24 LAB — GLUCOSE TOLERANCE, 2 HOURS W/ 1HR
GLUCOSE, 2 HOUR: 92 mg/dL (ref 65–152)
GLUCOSE, FASTING: 79 mg/dL (ref 65–91)
Glucose, 1 hour: 128 mg/dL (ref 65–179)

## 2016-08-24 LAB — HIV ANTIBODY (ROUTINE TESTING W REFLEX): HIV Screen 4th Generation wRfx: NONREACTIVE

## 2016-09-13 ENCOUNTER — Ambulatory Visit (INDEPENDENT_AMBULATORY_CARE_PROVIDER_SITE_OTHER): Payer: Medicaid Other | Admitting: Obstetrics & Gynecology

## 2016-09-13 VITALS — BP 127/79 | HR 80 | Wt 163.0 lb

## 2016-09-13 DIAGNOSIS — O0993 Supervision of high risk pregnancy, unspecified, third trimester: Secondary | ICD-10-CM

## 2016-09-13 DIAGNOSIS — O0990 Supervision of high risk pregnancy, unspecified, unspecified trimester: Secondary | ICD-10-CM

## 2016-09-13 NOTE — Progress Notes (Signed)
   PRENATAL VISIT NOTE  Subjective:  Lynn Garcia is a 21 y.o. G2P1001 at 8630w5d being seen today for ongoing prenatal care.  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  Patient reports no complaints.  Contractions: Not present. Vag. Bleeding: None.  Movement: Present. Denies leaking of fluid.   The following portions of the patient's history were reviewed and updated as appropriate: allergies, current medications, past family history, past medical history, past social history, past surgical history and problem list. Problem list updated.  Objective:   Vitals:   09/13/16 1039  BP: 127/79  Pulse: 80  Weight: 163 lb (73.9 kg)    Fetal Status: Fetal Heart Rate (bpm): 139   Movement: Present     General:  Alert, oriented and cooperative. Patient is in no acute distress.  Skin: Skin is warm and dry. No rash noted.   Cardiovascular: Normal heart rate noted  Respiratory: Normal respiratory effort, no problems with respiration noted  Abdomen: Soft, gravid, appropriate for gestational age.  Pain/Pressure: Absent     Pelvic: Cervical exam deferred        Extremities: Normal range of motion.  Edema: None  Mental Status:  Normal mood and affect. Normal behavior. Normal judgment and thought content.   Assessment and Plan:  Pregnancy: G2P1001 at 8030w5d  There are no diagnoses linked to this encounter. Preterm labor symptoms and general obstetric precautions including but not limited to vaginal bleeding, contractions, leaking of fluid and fetal movement were reviewed in detail with the patient. Please refer to After Visit Summary for other counseling recommendations.  Return in about 2 weeks (around 09/27/2016).   Scheryl DarterJames Arnold, MD

## 2016-09-13 NOTE — Patient Instructions (Signed)
Third Trimester of Pregnancy The third trimester is from week 28 through week 40 (months 7 through 9). The third trimester is a time when the unborn baby (fetus) is growing rapidly. At the end of the ninth month, the fetus is about 20 inches in length and weighs 6-10 pounds. Body changes during your third trimester Your body will continue to go through many changes during pregnancy. The changes vary from woman to woman. During the third trimester:  Your weight will continue to increase. You can expect to gain 25-35 pounds (11-16 kg) by the end of the pregnancy.  You may begin to get stretch marks on your hips, abdomen, and breasts.  You may urinate more often because the fetus is moving lower into your pelvis and pressing on your bladder.  You may develop or continue to have heartburn. This is caused by increased hormones that slow down muscles in the digestive tract.  You may develop or continue to have constipation because increased hormones slow digestion and cause the muscles that push waste through your intestines to relax.  You may develop hemorrhoids. These are swollen veins (varicose veins) in the rectum that can itch or be painful.  You may develop swollen, bulging veins (varicose veins) in your legs.  You may have increased body aches in the pelvis, back, or thighs. This is due to weight gain and increased hormones that are relaxing your joints.  You may have changes in your hair. These can include thickening of your hair, rapid growth, and changes in texture. Some women also have hair loss during or after pregnancy, or hair that feels dry or thin. Your hair will most likely return to normal after your baby is born.  Your breasts will continue to grow and they will continue to become tender. A yellow fluid (colostrum) may leak from your breasts. This is the first milk you are producing for your baby.  Your belly button may stick out.  You may notice more swelling in your hands,  face, or ankles.  You may have increased tingling or numbness in your hands, arms, and legs. The skin on your belly may also feel numb.  You may feel short of breath because of your expanding uterus.  You may have more problems sleeping. This can be caused by the size of your belly, increased need to urinate, and an increase in your body's metabolism.  You may notice the fetus "dropping," or moving lower in your abdomen (lightening).  You may have increased vaginal discharge.  You may notice your joints feel loose and you may have pain around your pelvic bone.  What to expect at prenatal visits You will have prenatal exams every 2 weeks until week 36. Then you will have weekly prenatal exams. During a routine prenatal visit:  You will be weighed to make sure you and the baby are growing normally.  Your blood pressure will be taken.  Your abdomen will be measured to track your baby's growth.  The fetal heartbeat will be listened to.  Any test results from the previous visit will be discussed.  You may have a cervical check near your due date to see if your cervix has softened or thinned (effaced).  You will be tested for Group B streptococcus. This happens between 35 and 37 weeks.  Your health care provider may ask you:  What your birth plan is.  How you are feeling.  If you are feeling the baby move.  If you have had   any abnormal symptoms, such as leaking fluid, bleeding, severe headaches, or abdominal cramping.  If you are using any tobacco products, including cigarettes, chewing tobacco, and electronic cigarettes.  If you have any questions.  Other tests or screenings that may be performed during your third trimester include:  Blood tests that check for low iron levels (anemia).  Fetal testing to check the health, activity level, and growth of the fetus. Testing is done if you have certain medical conditions or if there are problems during the  pregnancy.  Nonstress test (NST). This test checks the health of your baby to make sure there are no signs of problems, such as the baby not getting enough oxygen. During this test, a belt is placed around your belly. The baby is made to move, and its heart rate is monitored during movement.  What is false labor? False labor is a condition in which you feel small, irregular tightenings of the muscles in the womb (contractions) that usually go away with rest, changing position, or drinking water. These are called Braxton Hicks contractions. Contractions may last for hours, days, or even weeks before true labor sets in. If contractions come at regular intervals, become more frequent, increase in intensity, or become painful, you should see your health care provider. What are the signs of labor?  Abdominal cramps.  Regular contractions that start at 10 minutes apart and become stronger and more frequent with time.  Contractions that start on the top of the uterus and spread down to the lower abdomen and back.  Increased pelvic pressure and dull back pain.  A watery or bloody mucus discharge that comes from the vagina.  Leaking of amniotic fluid. This is also known as your "water breaking." It could be a slow trickle or a gush. Let your health care provider know if it has a color or strange odor. If you have any of these signs, call your health care provider right away, even if it is before your due date. Follow these instructions at home: Medicines  Follow your health care provider's instructions regarding medicine use. Specific medicines may be either safe or unsafe to take during pregnancy.  Take a prenatal vitamin that contains at least 600 micrograms (mcg) of folic acid.  If you develop constipation, try taking a stool softener if your health care provider approves. Eating and drinking  Eat a balanced diet that includes fresh fruits and vegetables, whole grains, good sources of protein  such as meat, eggs, or tofu, and low-fat dairy. Your health care provider will help you determine the amount of weight gain that is right for you.  Avoid raw meat and uncooked cheese. These carry germs that can cause birth defects in the baby.  If you have low calcium intake from food, talk to your health care provider about whether you should take a daily calcium supplement.  Eat four or five small meals rather than three large meals a day.  Limit foods that are high in fat and processed sugars, such as fried and sweet foods.  To prevent constipation: ? Drink enough fluid to keep your urine clear or pale yellow. ? Eat foods that are high in fiber, such as fresh fruits and vegetables, whole grains, and beans. Activity  Exercise only as directed by your health care provider. Most women can continue their usual exercise routine during pregnancy. Try to exercise for 30 minutes at least 5 days a week. Stop exercising if you experience uterine contractions.  Avoid heavy   lifting.  Do not exercise in extreme heat or humidity, or at high altitudes.  Wear low-heel, comfortable shoes.  Practice good posture.  You may continue to have sex unless your health care provider tells you otherwise. Relieving pain and discomfort  Take frequent breaks and rest with your legs elevated if you have leg cramps or low back pain.  Take warm sitz baths to soothe any pain or discomfort caused by hemorrhoids. Use hemorrhoid cream if your health care provider approves.  Wear a good support bra to prevent discomfort from breast tenderness.  If you develop varicose veins: ? Wear support pantyhose or compression stockings as told by your healthcare provider. ? Elevate your feet for 15 minutes, 3-4 times a day. Prenatal care  Write down your questions. Take them to your prenatal visits.  Keep all your prenatal visits as told by your health care provider. This is important. Safety  Wear your seat belt at  all times when driving.  Make a list of emergency phone numbers, including numbers for family, friends, the hospital, and police and fire departments. General instructions  Avoid cat litter boxes and soil used by cats. These carry germs that can cause birth defects in the baby. If you have a cat, ask someone to clean the litter box for you.  Do not travel far distances unless it is absolutely necessary and only with the approval of your health care provider.  Do not use hot tubs, steam rooms, or saunas.  Do not drink alcohol.  Do not use any products that contain nicotine or tobacco, such as cigarettes and e-cigarettes. If you need help quitting, ask your health care provider.  Do not use any medicinal herbs or unprescribed drugs. These chemicals affect the formation and growth of the baby.  Do not douche or use tampons or scented sanitary pads.  Do not cross your legs for long periods of time.  To prepare for the arrival of your baby: ? Take prenatal classes to understand, practice, and ask questions about labor and delivery. ? Make a trial run to the hospital. ? Visit the hospital and tour the maternity area. ? Arrange for maternity or paternity leave through employers. ? Arrange for family and friends to take care of pets while you are in the hospital. ? Purchase a rear-facing car seat and make sure you know how to install it in your car. ? Pack your hospital bag. ? Prepare the baby's nursery. Make sure to remove all pillows and stuffed animals from the baby's crib to prevent suffocation.  Visit your dentist if you have not gone during your pregnancy. Use a soft toothbrush to brush your teeth and be gentle when you floss. Contact a health care provider if:  You are unsure if you are in labor or if your water has broken.  You become dizzy.  You have mild pelvic cramps, pelvic pressure, or nagging pain in your abdominal area.  You have lower back pain.  You have persistent  nausea, vomiting, or diarrhea.  You have an unusual or bad smelling vaginal discharge.  You have pain when you urinate. Get help right away if:  Your water breaks before 37 weeks.  You have regular contractions less than 5 minutes apart before 37 weeks.  You have a fever.  You are leaking fluid from your vagina.  You have spotting or bleeding from your vagina.  You have severe abdominal pain or cramping.  You have rapid weight loss or weight gain.    You have shortness of breath with chest pain.  You notice sudden or extreme swelling of your face, hands, ankles, feet, or legs.  Your baby makes fewer than 10 movements in 2 hours.  You have severe headaches that do not go away when you take medicine.  You have vision changes. Summary  The third trimester is from week 28 through week 40, months 7 through 9. The third trimester is a time when the unborn baby (fetus) is growing rapidly.  During the third trimester, your discomfort may increase as you and your baby continue to gain weight. You may have abdominal, leg, and back pain, sleeping problems, and an increased need to urinate.  During the third trimester your breasts will keep growing and they will continue to become tender. A yellow fluid (colostrum) may leak from your breasts. This is the first milk you are producing for your baby.  False labor is a condition in which you feel small, irregular tightenings of the muscles in the womb (contractions) that eventually go away. These are called Braxton Hicks contractions. Contractions may last for hours, days, or even weeks before true labor sets in.  Signs of labor can include: abdominal cramps; regular contractions that start at 10 minutes apart and become stronger and more frequent with time; watery or bloody mucus discharge that comes from the vagina; increased pelvic pressure and dull back pain; and leaking of amniotic fluid. This information is not intended to replace advice  given to you by your health care provider. Make sure you discuss any questions you have with your health care provider. Document Released: 01/10/2001 Document Revised: 06/24/2015 Document Reviewed: 03/19/2012 Elsevier Interactive Patient Education  2017 [employer]  

## 2016-09-15 ENCOUNTER — Encounter (HOSPITAL_COMMUNITY): Payer: Self-pay

## 2016-09-15 ENCOUNTER — Ambulatory Visit (HOSPITAL_COMMUNITY)
Admission: RE | Admit: 2016-09-15 | Discharge: 2016-09-15 | Disposition: A | Discharge status: Home / Self Care | Payer: Medicaid Other | Source: Ambulatory Visit | Attending: Certified Nurse Midwife | Admitting: Certified Nurse Midwife

## 2016-09-15 ENCOUNTER — Other Ambulatory Visit (HOSPITAL_COMMUNITY): Payer: Self-pay | Admitting: Maternal and Fetal Medicine

## 2016-09-15 DIAGNOSIS — Z8759 Personal history of other complications of pregnancy, childbirth and the puerperium: Secondary | ICD-10-CM | POA: Diagnosis not present

## 2016-09-15 DIAGNOSIS — O09299 Supervision of pregnancy with other poor reproductive or obstetric history, unspecified trimester: Secondary | ICD-10-CM | POA: Insufficient documentation

## 2016-09-15 DIAGNOSIS — O10919 Unspecified pre-existing hypertension complicating pregnancy, unspecified trimester: Secondary | ICD-10-CM

## 2016-09-15 DIAGNOSIS — O09893 Supervision of other high risk pregnancies, third trimester: Secondary | ICD-10-CM | POA: Diagnosis not present

## 2016-09-15 DIAGNOSIS — O10019 Pre-existing essential hypertension complicating pregnancy, unspecified trimester: Secondary | ICD-10-CM | POA: Insufficient documentation

## 2016-09-15 DIAGNOSIS — Z3A30 30 weeks gestation of pregnancy: Secondary | ICD-10-CM | POA: Diagnosis not present

## 2016-09-18 ENCOUNTER — Other Ambulatory Visit (HOSPITAL_COMMUNITY): Payer: Self-pay | Admitting: *Deleted

## 2016-09-18 DIAGNOSIS — IMO0002 Reserved for concepts with insufficient information to code with codable children: Secondary | ICD-10-CM

## 2016-09-22 ENCOUNTER — Other Ambulatory Visit (HOSPITAL_COMMUNITY): Payer: Self-pay | Admitting: Obstetrics and Gynecology

## 2016-09-22 ENCOUNTER — Encounter (HOSPITAL_COMMUNITY): Payer: Self-pay

## 2016-09-22 ENCOUNTER — Ambulatory Visit (HOSPITAL_COMMUNITY)
Admission: RE | Admit: 2016-09-22 | Discharge: 2016-09-22 | Disposition: A | Discharge status: Home / Self Care | Payer: Medicaid Other | Source: Ambulatory Visit | Attending: Obstetrics & Gynecology | Admitting: Obstetrics & Gynecology

## 2016-09-22 DIAGNOSIS — O09893 Supervision of other high risk pregnancies, third trimester: Secondary | ICD-10-CM | POA: Diagnosis not present

## 2016-09-22 DIAGNOSIS — Z3A31 31 weeks gestation of pregnancy: Secondary | ICD-10-CM | POA: Insufficient documentation

## 2016-09-22 DIAGNOSIS — O09293 Supervision of pregnancy with other poor reproductive or obstetric history, third trimester: Secondary | ICD-10-CM | POA: Diagnosis present

## 2016-09-22 DIAGNOSIS — O10013 Pre-existing essential hypertension complicating pregnancy, third trimester: Secondary | ICD-10-CM

## 2016-09-22 DIAGNOSIS — IMO0002 Reserved for concepts with insufficient information to code with codable children: Secondary | ICD-10-CM

## 2016-09-22 DIAGNOSIS — O113 Pre-existing hypertension with pre-eclampsia, third trimester: Secondary | ICD-10-CM | POA: Diagnosis present

## 2016-09-27 ENCOUNTER — Ambulatory Visit (INDEPENDENT_AMBULATORY_CARE_PROVIDER_SITE_OTHER): Payer: Medicaid Other | Admitting: Obstetrics & Gynecology

## 2016-09-27 ENCOUNTER — Encounter: Payer: Self-pay | Admitting: Obstetrics & Gynecology

## 2016-09-27 VITALS — BP 125/75 | HR 82 | Wt 164.0 lb

## 2016-09-27 DIAGNOSIS — O0990 Supervision of high risk pregnancy, unspecified, unspecified trimester: Secondary | ICD-10-CM

## 2016-09-27 NOTE — Patient Instructions (Signed)
Third Trimester of Pregnancy The third trimester is from week 28 through week 40 (months 7 through 9). The third trimester is a time when the unborn baby (fetus) is growing rapidly. At the end of the ninth month, the fetus is about 20 inches in length and weighs 6-10 pounds. Body changes during your third trimester Your body will continue to go through many changes during pregnancy. The changes vary from woman to woman. During the third trimester:  Your weight will continue to increase. You can expect to gain 25-35 pounds (11-16 kg) by the end of the pregnancy.  You may begin to get stretch marks on your hips, abdomen, and breasts.  You may urinate more often because the fetus is moving lower into your pelvis and pressing on your bladder.  You may develop or continue to have heartburn. This is caused by increased hormones that slow down muscles in the digestive tract.  You may develop or continue to have constipation because increased hormones slow digestion and cause the muscles that push waste through your intestines to relax.  You may develop hemorrhoids. These are swollen veins (varicose veins) in the rectum that can itch or be painful.  You may develop swollen, bulging veins (varicose veins) in your legs.  You may have increased body aches in the pelvis, back, or thighs. This is due to weight gain and increased hormones that are relaxing your joints.  You may have changes in your hair. These can include thickening of your hair, rapid growth, and changes in texture. Some women also have hair loss during or after pregnancy, or hair that feels dry or thin. Your hair will most likely return to normal after your baby is born.  Your breasts will continue to grow and they will continue to become tender. A yellow fluid (colostrum) may leak from your breasts. This is the first milk you are producing for your baby.  Your belly button may stick out.  You may notice more swelling in your hands,  face, or ankles.  You may have increased tingling or numbness in your hands, arms, and legs. The skin on your belly may also feel numb.  You may feel short of breath because of your expanding uterus.  You may have more problems sleeping. This can be caused by the size of your belly, increased need to urinate, and an increase in your body's metabolism.  You may notice the fetus "dropping," or moving lower in your abdomen (lightening).  You may have increased vaginal discharge.  You may notice your joints feel loose and you may have pain around your pelvic bone.  What to expect at prenatal visits You will have prenatal exams every 2 weeks until week 36. Then you will have weekly prenatal exams. During a routine prenatal visit:  You will be weighed to make sure you and the baby are growing normally.  Your blood pressure will be taken.  Your abdomen will be measured to track your baby's growth.  The fetal heartbeat will be listened to.  Any test results from the previous visit will be discussed.  You may have a cervical check near your due date to see if your cervix has softened or thinned (effaced).  You will be tested for Group B streptococcus. This happens between 35 and 37 weeks.  Your health care provider may ask you:  What your birth plan is.  How you are feeling.  If you are feeling the baby move.  If you have had   any abnormal symptoms, such as leaking fluid, bleeding, severe headaches, or abdominal cramping.  If you are using any tobacco products, including cigarettes, chewing tobacco, and electronic cigarettes.  If you have any questions.  Other tests or screenings that may be performed during your third trimester include:  Blood tests that check for low iron levels (anemia).  Fetal testing to check the health, activity level, and growth of the fetus. Testing is done if you have certain medical conditions or if there are problems during the  pregnancy.  Nonstress test (NST). This test checks the health of your baby to make sure there are no signs of problems, such as the baby not getting enough oxygen. During this test, a belt is placed around your belly. The baby is made to move, and its heart rate is monitored during movement.  What is false labor? False labor is a condition in which you feel small, irregular tightenings of the muscles in the womb (contractions) that usually go away with rest, changing position, or drinking water. These are called Braxton Hicks contractions. Contractions may last for hours, days, or even weeks before true labor sets in. If contractions come at regular intervals, become more frequent, increase in intensity, or become painful, you should see your health care provider. What are the signs of labor?  Abdominal cramps.  Regular contractions that start at 10 minutes apart and become stronger and more frequent with time.  Contractions that start on the top of the uterus and spread down to the lower abdomen and back.  Increased pelvic pressure and dull back pain.  A watery or bloody mucus discharge that comes from the vagina.  Leaking of amniotic fluid. This is also known as your "water breaking." It could be a slow trickle or a gush. Let your health care provider know if it has a color or strange odor. If you have any of these signs, call your health care provider right away, even if it is before your due date. Follow these instructions at home: Medicines  Follow your health care provider's instructions regarding medicine use. Specific medicines may be either safe or unsafe to take during pregnancy.  Take a prenatal vitamin that contains at least 600 micrograms (mcg) of folic acid.  If you develop constipation, try taking a stool softener if your health care provider approves. Eating and drinking  Eat a balanced diet that includes fresh fruits and vegetables, whole grains, good sources of protein  such as meat, eggs, or tofu, and low-fat dairy. Your health care provider will help you determine the amount of weight gain that is right for you.  Avoid raw meat and uncooked cheese. These carry germs that can cause birth defects in the baby.  If you have low calcium intake from food, talk to your health care provider about whether you should take a daily calcium supplement.  Eat four or five small meals rather than three large meals a day.  Limit foods that are high in fat and processed sugars, such as fried and sweet foods.  To prevent constipation: ? Drink enough fluid to keep your urine clear or pale yellow. ? Eat foods that are high in fiber, such as fresh fruits and vegetables, whole grains, and beans. Activity  Exercise only as directed by your health care provider. Most women can continue their usual exercise routine during pregnancy. Try to exercise for 30 minutes at least 5 days a week. Stop exercising if you experience uterine contractions.  Avoid heavy   lifting.  Do not exercise in extreme heat or humidity, or at high altitudes.  Wear low-heel, comfortable shoes.  Practice good posture.  You may continue to have sex unless your health care provider tells you otherwise. Relieving pain and discomfort  Take frequent breaks and rest with your legs elevated if you have leg cramps or low back pain.  Take warm sitz baths to soothe any pain or discomfort caused by hemorrhoids. Use hemorrhoid cream if your health care provider approves.  Wear a good support bra to prevent discomfort from breast tenderness.  If you develop varicose veins: ? Wear support pantyhose or compression stockings as told by your healthcare provider. ? Elevate your feet for 15 minutes, 3-4 times a day. Prenatal care  Write down your questions. Take them to your prenatal visits.  Keep all your prenatal visits as told by your health care provider. This is important. Safety  Wear your seat belt at  all times when driving.  Make a list of emergency phone numbers, including numbers for family, friends, the hospital, and police and fire departments. General instructions  Avoid cat litter boxes and soil used by cats. These carry germs that can cause birth defects in the baby. If you have a cat, ask someone to clean the litter box for you.  Do not travel far distances unless it is absolutely necessary and only with the approval of your health care provider.  Do not use hot tubs, steam rooms, or saunas.  Do not drink alcohol.  Do not use any products that contain nicotine or tobacco, such as cigarettes and e-cigarettes. If you need help quitting, ask your health care provider.  Do not use any medicinal herbs or unprescribed drugs. These chemicals affect the formation and growth of the baby.  Do not douche or use tampons or scented sanitary pads.  Do not cross your legs for long periods of time.  To prepare for the arrival of your baby: ? Take prenatal classes to understand, practice, and ask questions about labor and delivery. ? Make a trial run to the hospital. ? Visit the hospital and tour the maternity area. ? Arrange for maternity or paternity leave through employers. ? Arrange for family and friends to take care of pets while you are in the hospital. ? Purchase a rear-facing car seat and make sure you know how to install it in your car. ? Pack your hospital bag. ? Prepare the baby's nursery. Make sure to remove all pillows and stuffed animals from the baby's crib to prevent suffocation.  Visit your dentist if you have not gone during your pregnancy. Use a soft toothbrush to brush your teeth and be gentle when you floss. Contact a health care provider if:  You are unsure if you are in labor or if your water has broken.  You become dizzy.  You have mild pelvic cramps, pelvic pressure, or nagging pain in your abdominal area.  You have lower back pain.  You have persistent  nausea, vomiting, or diarrhea.  You have an unusual or bad smelling vaginal discharge.  You have pain when you urinate. Get help right away if:  Your water breaks before 37 weeks.  You have regular contractions less than 5 minutes apart before 37 weeks.  You have a fever.  You are leaking fluid from your vagina.  You have spotting or bleeding from your vagina.  You have severe abdominal pain or cramping.  You have rapid weight loss or weight gain.    You have shortness of breath with chest pain.  You notice sudden or extreme swelling of your face, hands, ankles, feet, or legs.  Your baby makes fewer than 10 movements in 2 hours.  You have severe headaches that do not go away when you take medicine.  You have vision changes. Summary  The third trimester is from week 28 through week 40, months 7 through 9. The third trimester is a time when the unborn baby (fetus) is growing rapidly.  During the third trimester, your discomfort may increase as you and your baby continue to gain weight. You may have abdominal, leg, and back pain, sleeping problems, and an increased need to urinate.  During the third trimester your breasts will keep growing and they will continue to become tender. A yellow fluid (colostrum) may leak from your breasts. This is the first milk you are producing for your baby.  False labor is a condition in which you feel small, irregular tightenings of the muscles in the womb (contractions) that eventually go away. These are called Braxton Hicks contractions. Contractions may last for hours, days, or even weeks before true labor sets in.  Signs of labor can include: abdominal cramps; regular contractions that start at 10 minutes apart and become stronger and more frequent with time; watery or bloody mucus discharge that comes from the vagina; increased pelvic pressure and dull back pain; and leaking of amniotic fluid. This information is not intended to replace advice  given to you by your health care provider. Make sure you discuss any questions you have with your health care provider. Document Released: 01/10/2001 Document Revised: 06/24/2015 Document Reviewed: 03/19/2012 Elsevier Interactive Patient Education  2017 [employer]  

## 2016-09-27 NOTE — Progress Notes (Signed)
   PRENATAL VISIT NOTE  Subjective:  Lynn Garcia is a 21 y.o. G2P1001 at [redacted]w[redacted]d being seen today for ongoing prenatal care.  She is currently monitored for the following issues for this high-risk pregnancy and has Chronic hypertension; Supervision of high risk pregnancy, antepartum; Low vitamin D level; and LGSIL on Pap smear of cervix on her problem list.  Patient reports no complaints.  Contractions: Irregular. Vag. Bleeding: None.  Movement: Present. Denies leaking of fluid.   The following portions of the patient's history were reviewed and updated as appropriate: allergies, current medications, past family history, past medical history, past social history, past surgical history and problem list. Problem list updated.  Objective:   Vitals:   09/27/16 0916  BP: 125/75  Pulse: 82  Weight: 74.4 kg (164 lb)    Fetal Status:     Movement: Present     General:  Alert, oriented and cooperative. Patient is in no acute distress.  Skin: Skin is warm and dry. No rash noted.   Cardiovascular: Normal heart rate noted  Respiratory: Normal respiratory effort, no problems with respiration noted  Abdomen: Soft, gravid, appropriate for gestational age.  Pain/Pressure: Absent     Pelvic: Cervical exam deferred        Extremities: Normal range of motion.  Edema: None  Mental Status:  Normal mood and affect. Normal behavior. Normal judgment and thought content.   Assessment and Plan:  Pregnancy: G2P1001 at [redacted]w[redacted]d  There are no diagnoses linked to this encounter. Preterm labor symptoms and general obstetric precautions including but not limited to vaginal bleeding, contractions, leaking of fluid and fetal movement were reviewed in detail with the patient. Please refer to After Visit Summary for other counseling recommendations.  Return in about 1 week (around 10/04/2016).   Scheryl Darter, MD

## 2016-09-29 ENCOUNTER — Ambulatory Visit (HOSPITAL_COMMUNITY)
Admission: RE | Admit: 2016-09-29 | Discharge: 2016-09-29 | Disposition: A | Discharge status: Home / Self Care | Payer: Medicaid Other | Source: Ambulatory Visit | Attending: Obstetrics & Gynecology | Admitting: Obstetrics & Gynecology

## 2016-09-29 ENCOUNTER — Encounter (HOSPITAL_COMMUNITY): Payer: Self-pay

## 2016-09-29 DIAGNOSIS — IMO0002 Reserved for concepts with insufficient information to code with codable children: Secondary | ICD-10-CM

## 2016-09-29 DIAGNOSIS — O321XX0 Maternal care for breech presentation, not applicable or unspecified: Secondary | ICD-10-CM | POA: Diagnosis not present

## 2016-09-29 DIAGNOSIS — O10013 Pre-existing essential hypertension complicating pregnancy, third trimester: Secondary | ICD-10-CM | POA: Diagnosis not present

## 2016-09-29 DIAGNOSIS — Z3A32 32 weeks gestation of pregnancy: Secondary | ICD-10-CM | POA: Insufficient documentation

## 2016-09-29 DIAGNOSIS — O09293 Supervision of pregnancy with other poor reproductive or obstetric history, third trimester: Secondary | ICD-10-CM | POA: Insufficient documentation

## 2016-09-29 DIAGNOSIS — O365930 Maternal care for other known or suspected poor fetal growth, third trimester, not applicable or unspecified: Secondary | ICD-10-CM | POA: Insufficient documentation

## 2016-09-29 DIAGNOSIS — I10 Essential (primary) hypertension: Secondary | ICD-10-CM

## 2016-09-29 NOTE — Procedures (Signed)
Pauline GoodHeroinesha Bard 1995/02/10 5820w0d  Fetus A Non-Stress Test Interpretation for 09/29/16  Indication: Unsatisfactory BPP  Fetal Heart Rate A Mode: External Baseline Rate (A): 125 bpm Variability: Moderate Accelerations: 10 x 10, 15 x 15 Decelerations: None Multiple birth?: No  Uterine Activity Mode: Palpation, Toco Contraction Frequency (min): U/I Contraction Duration (sec): 30 Contraction Quality: Mild (pt denies feeling) Resting Tone Palpated: Relaxed Resting Time: Adequate  Interpretation (Fetal Testing) Nonstress Test Interpretation: Reactive Overall Impression: Reassuring for gestational age Comments: Reviewed tracing with Dr. Marjo Bickerenney

## 2016-10-04 ENCOUNTER — Ambulatory Visit (INDEPENDENT_AMBULATORY_CARE_PROVIDER_SITE_OTHER): Payer: Medicaid Other | Admitting: Obstetrics and Gynecology

## 2016-10-04 VITALS — BP 116/68 | HR 85 | Wt 165.0 lb

## 2016-10-04 DIAGNOSIS — O0990 Supervision of high risk pregnancy, unspecified, unspecified trimester: Secondary | ICD-10-CM

## 2016-10-04 DIAGNOSIS — I10 Essential (primary) hypertension: Secondary | ICD-10-CM

## 2016-10-04 DIAGNOSIS — Z23 Encounter for immunization: Secondary | ICD-10-CM

## 2016-10-04 DIAGNOSIS — O365930 Maternal care for other known or suspected poor fetal growth, third trimester, not applicable or unspecified: Secondary | ICD-10-CM

## 2016-10-04 DIAGNOSIS — O0993 Supervision of high risk pregnancy, unspecified, third trimester: Secondary | ICD-10-CM

## 2016-10-04 NOTE — Progress Notes (Signed)
   PRENATAL VISIT NOTE  Subjective:  Pauline GoodHeroinesha Gladu is a 21 y.o. G2P1001 at 7816w5d being seen today for ongoing prenatal care.  She is currently monitored for the following issues for this high-risk pregnancy and has Chronic hypertension; Supervision of high risk pregnancy, antepartum; Low vitamin D level; LGSIL on Pap smear of cervix; and IUGR (intrauterine growth restriction) affecting care of mother on her problem list.  Patient reports no complaints.  Contractions: Not present. Vag. Bleeding: None.  Movement: Present. Denies leaking of fluid.   The following portions of the patient's history were reviewed and updated as appropriate: allergies, current medications, past family history, past medical history, past social history, past surgical history and problem list. Problem list updated.  Objective:   Vitals:   10/04/16 1105  BP: 116/68  Pulse: 85  Weight: 165 lb (74.8 kg)    Fetal Status: Fetal Heart Rate (bpm): 130 (Simultaneous filing. User may not have seen previous data.) Fundal Height: 30 cm Movement: Present     General:  Alert, oriented and cooperative. Patient is in no acute distress.  Skin: Skin is warm and dry. No rash noted.   Cardiovascular: Normal heart rate noted  Respiratory: Normal respiratory effort, no problems with respiration noted  Abdomen: Soft, gravid, appropriate for gestational age.  Pain/Pressure: Present     Pelvic: Cervical exam deferred        Extremities: Normal range of motion.  Edema: Trace  Mental Status:  Normal mood and affect. Normal behavior. Normal judgment and thought content.   Assessment and Plan:  Pregnancy: G2P1001 at 4616w5d  1. Chronic hypertension Stable without meds Continue daily ASA NST today- reviewed and reactive with baseline 130, mod variability, +accels, no decels Continue weekly BPP with doppler  2. Supervision of high risk pregnancy, antepartum Patient is doing well  - Flu Vaccine QUAD 36+ mos IM (Fluarix, Quad  PF)  3. Poor fetal growth affecting management of mother in third trimester, single or unspecified fetus Continue weekly BPP with doppler Growth ultrasound scheduled  Preterm labor symptoms and general obstetric precautions including but not limited to vaginal bleeding, contractions, leaking of fluid and fetal movement were reviewed in detail with the patient. Please refer to After Visit Summary for other counseling recommendations.  No Follow-up on file.   Catalina AntiguaPeggy Adrine Hayworth, MD  Physical Exam  Exam

## 2016-10-06 ENCOUNTER — Other Ambulatory Visit (HOSPITAL_COMMUNITY): Payer: Self-pay | Admitting: *Deleted

## 2016-10-06 ENCOUNTER — Ambulatory Visit (HOSPITAL_COMMUNITY)
Admission: RE | Admit: 2016-10-06 | Discharge: 2016-10-06 | Disposition: A | Discharge status: Home / Self Care | Payer: Medicaid Other | Source: Ambulatory Visit | Attending: Obstetrics and Gynecology | Admitting: Obstetrics and Gynecology

## 2016-10-06 ENCOUNTER — Encounter (HOSPITAL_COMMUNITY): Payer: Self-pay

## 2016-10-06 DIAGNOSIS — Z3A33 33 weeks gestation of pregnancy: Secondary | ICD-10-CM | POA: Insufficient documentation

## 2016-10-06 DIAGNOSIS — IMO0002 Reserved for concepts with insufficient information to code with codable children: Secondary | ICD-10-CM

## 2016-10-06 DIAGNOSIS — Z8759 Personal history of other complications of pregnancy, childbirth and the puerperium: Secondary | ICD-10-CM

## 2016-10-12 ENCOUNTER — Ambulatory Visit (INDEPENDENT_AMBULATORY_CARE_PROVIDER_SITE_OTHER): Payer: Medicaid Other | Admitting: Certified Nurse Midwife

## 2016-10-12 VITALS — BP 115/69 | HR 87 | Wt 167.0 lb

## 2016-10-12 DIAGNOSIS — I10 Essential (primary) hypertension: Secondary | ICD-10-CM

## 2016-10-12 DIAGNOSIS — O0990 Supervision of high risk pregnancy, unspecified, unspecified trimester: Secondary | ICD-10-CM

## 2016-10-12 DIAGNOSIS — O0993 Supervision of high risk pregnancy, unspecified, third trimester: Secondary | ICD-10-CM

## 2016-10-12 DIAGNOSIS — O10913 Unspecified pre-existing hypertension complicating pregnancy, third trimester: Secondary | ICD-10-CM | POA: Diagnosis not present

## 2016-10-12 DIAGNOSIS — R7989 Other specified abnormal findings of blood chemistry: Secondary | ICD-10-CM

## 2016-10-12 DIAGNOSIS — O365930 Maternal care for other known or suspected poor fetal growth, third trimester, not applicable or unspecified: Secondary | ICD-10-CM

## 2016-10-12 DIAGNOSIS — E559 Vitamin D deficiency, unspecified: Secondary | ICD-10-CM

## 2016-10-12 DIAGNOSIS — R87612 Low grade squamous intraepithelial lesion on cytologic smear of cervix (LGSIL): Secondary | ICD-10-CM

## 2016-10-13 ENCOUNTER — Encounter (HOSPITAL_COMMUNITY): Payer: Self-pay

## 2016-10-13 ENCOUNTER — Ambulatory Visit (HOSPITAL_COMMUNITY)
Admission: RE | Admit: 2016-10-13 | Discharge: 2016-10-13 | Disposition: A | Discharge status: Home / Self Care | Payer: Medicaid Other | Source: Ambulatory Visit | Attending: Obstetrics and Gynecology | Admitting: Obstetrics and Gynecology

## 2016-10-13 ENCOUNTER — Other Ambulatory Visit (HOSPITAL_COMMUNITY): Payer: Self-pay | Admitting: *Deleted

## 2016-10-13 DIAGNOSIS — O09293 Supervision of pregnancy with other poor reproductive or obstetric history, third trimester: Secondary | ICD-10-CM | POA: Insufficient documentation

## 2016-10-13 DIAGNOSIS — O10013 Pre-existing essential hypertension complicating pregnancy, third trimester: Secondary | ICD-10-CM | POA: Diagnosis not present

## 2016-10-13 DIAGNOSIS — Z3A34 34 weeks gestation of pregnancy: Secondary | ICD-10-CM | POA: Diagnosis not present

## 2016-10-13 DIAGNOSIS — Z8759 Personal history of other complications of pregnancy, childbirth and the puerperium: Secondary | ICD-10-CM

## 2016-10-13 DIAGNOSIS — IMO0002 Reserved for concepts with insufficient information to code with codable children: Secondary | ICD-10-CM

## 2016-10-13 DIAGNOSIS — O10919 Unspecified pre-existing hypertension complicating pregnancy, unspecified trimester: Secondary | ICD-10-CM

## 2016-10-13 NOTE — Progress Notes (Signed)
   PRENATAL VISIT NOTE  Subjective:  Lynn Garcia is a 21 y.o. G2P1001 at [redacted]w[redacted]d being seen today for ongoing prenatal care.  She is currently monitored for the following issues for this high-risk pregnancy and has Chronic hypertension; Supervision of high risk pregnancy, antepartum; Low vitamin D level; LGSIL on Pap smear of cervix; and IUGR (intrauterine growth restriction) affecting care of mother on her problem list.  Patient reports no complaints.  Contractions: Not present. Vag. Bleeding: None.  Movement: Present. Denies leaking of fluid.   The following portions of the patient's history were reviewed and updated as appropriate: allergies, current medications, past family history, past medical history, past social history, past surgical history and problem list. Problem list updated.  Objective:   Vitals:   10/12/16 0923  BP: 115/69  Pulse: 87  Weight: 167 lb (75.8 kg)    Fetal Status:   Fundal Height: 32 cm Movement: Present     General:  Alert, oriented and cooperative. Patient is in no acute distress.  Skin: Skin is warm and dry. No rash noted.   Cardiovascular: Normal heart rate noted  Respiratory: Normal respiratory effort, no problems with respiration noted  Abdomen: Soft, gravid, appropriate for gestational age.  Pain/Pressure: Present     Pelvic: Cervical exam deferred        Extremities: Normal range of motion.  Edema: Trace  Mental Status:  Normal mood and affect. Normal behavior. Normal judgment and thought content.  NST: + accels, no decels, moderate variability, Cat. 1 tracing. No contractions on toco.   Assessment and Plan:  Pregnancy: G2P1001 at [redacted]w[redacted]d  1. Supervision of high risk pregnancy, antepartum     Reactive NST - Fetal nonstress test; Future  2. Low vitamin D level     Taking weekly vitamin D  3. LGSIL on Pap smear of cervix     Colpo postpartum  4. Poor fetal growth affecting management of mother in third trimester, single or unspecified  fetus     [place] 9/7 BPP 8/8, normal range dopplers. [place] scheduled for 10/13/16.      5. Chronic hypertension      B/P stable, on baby ASA, no other meds.   Preterm labor symptoms and general obstetric precautions including but not limited to vaginal bleeding, contractions, leaking of fluid and fetal movement were reviewed in detail with the patient. Please refer to After Visit Summary for other counseling recommendations.  Return in about 1 week (around 10/19/2016) for [facility], Needs to see FP MD here, Bi-weekly Antenatal testing.   Roe Coombs, CNM

## 2016-10-17 ENCOUNTER — Other Ambulatory Visit (HOSPITAL_COMMUNITY): Payer: Self-pay | Admitting: *Deleted

## 2016-10-17 DIAGNOSIS — O10919 Unspecified pre-existing hypertension complicating pregnancy, unspecified trimester: Secondary | ICD-10-CM

## 2016-10-19 ENCOUNTER — Encounter: Payer: Medicaid Other | Admitting: Certified Nurse Midwife

## 2016-10-20 ENCOUNTER — Encounter (HOSPITAL_COMMUNITY): Payer: Self-pay

## 2016-10-20 ENCOUNTER — Other Ambulatory Visit (HOSPITAL_COMMUNITY): Payer: Self-pay | Admitting: Maternal and Fetal Medicine

## 2016-10-20 ENCOUNTER — Ambulatory Visit (HOSPITAL_COMMUNITY)
Admission: RE | Admit: 2016-10-20 | Discharge: 2016-10-20 | Disposition: A | Discharge status: Home / Self Care | Payer: Medicaid Other | Source: Ambulatory Visit | Attending: Obstetrics and Gynecology | Admitting: Obstetrics and Gynecology

## 2016-10-20 ENCOUNTER — Inpatient Hospital Stay (HOSPITAL_COMMUNITY)
Admission: AD | Admit: 2016-10-20 | Discharge: 2016-10-20 | Disposition: A | Discharge status: Home / Self Care | Payer: Medicaid Other | Source: Ambulatory Visit | Attending: Obstetrics & Gynecology | Admitting: Obstetrics & Gynecology

## 2016-10-20 DIAGNOSIS — Z87891 Personal history of nicotine dependence: Secondary | ICD-10-CM | POA: Insufficient documentation

## 2016-10-20 DIAGNOSIS — O4703 False labor before 37 completed weeks of gestation, third trimester: Secondary | ICD-10-CM

## 2016-10-20 DIAGNOSIS — Z3A35 35 weeks gestation of pregnancy: Secondary | ICD-10-CM | POA: Diagnosis not present

## 2016-10-20 DIAGNOSIS — O2343 Unspecified infection of urinary tract in pregnancy, third trimester: Secondary | ICD-10-CM | POA: Diagnosis not present

## 2016-10-20 DIAGNOSIS — O10013 Pre-existing essential hypertension complicating pregnancy, third trimester: Secondary | ICD-10-CM | POA: Diagnosis not present

## 2016-10-20 DIAGNOSIS — O365930 Maternal care for other known or suspected poor fetal growth, third trimester, not applicable or unspecified: Secondary | ICD-10-CM

## 2016-10-20 DIAGNOSIS — O09293 Supervision of pregnancy with other poor reproductive or obstetric history, third trimester: Secondary | ICD-10-CM | POA: Insufficient documentation

## 2016-10-20 DIAGNOSIS — O26893 Other specified pregnancy related conditions, third trimester: Secondary | ICD-10-CM | POA: Diagnosis present

## 2016-10-20 DIAGNOSIS — O10919 Unspecified pre-existing hypertension complicating pregnancy, unspecified trimester: Secondary | ICD-10-CM

## 2016-10-20 DIAGNOSIS — Z7982 Long term (current) use of aspirin: Secondary | ICD-10-CM | POA: Diagnosis not present

## 2016-10-20 HISTORY — DX: Urinary tract infection, site not specified: N39.0

## 2016-10-20 LAB — URINALYSIS, ROUTINE W REFLEX MICROSCOPIC
Bacteria, UA: NONE SEEN
Bilirubin Urine: NEGATIVE
GLUCOSE, UA: NEGATIVE mg/dL
Hgb urine dipstick: NEGATIVE
Ketones, ur: NEGATIVE mg/dL
NITRITE: NEGATIVE
PROTEIN: NEGATIVE mg/dL
Specific Gravity, Urine: 1.019 (ref 1.005–1.030)
pH: 6 (ref 5.0–8.0)

## 2016-10-20 MED ORDER — NIFEDIPINE 10 MG PO CAPS
10.0000 mg | ORAL_CAPSULE | ORAL | Status: AC | PRN
  Administered 2016-10-20 (×3): 10 mg via ORAL
  Filled 2016-10-20 (×5): qty 1

## 2016-10-20 MED ORDER — CEPHALEXIN 500 MG PO CAPS
500.0000 mg | ORAL_CAPSULE | ORAL | Status: AC
  Administered 2016-10-20: 500 mg via ORAL
  Filled 2016-10-20: qty 1

## 2016-10-20 MED ORDER — CEPHALEXIN 500 MG PO CAPS: 500.0000 mg | ORAL_CAPSULE | Freq: Four times a day (QID) | ORAL | 0 refills | Status: DC

## 2016-10-20 MED ORDER — CEPHALEXIN 500 MG PO CAPS
500.0000 mg | ORAL_CAPSULE | Freq: Once | ORAL | Status: DC
  Filled 2016-10-20: qty 1

## 2016-10-20 NOTE — MAU Provider Note (Signed)
Chief Complaint:  Contractions   First Provider Initiated Contact with Patient 10/20/16 1223      HPI: Lynn Garcia is a 21 y.o. G2P1001 at [redacted]w[redacted]d who presents to maternity admissions reporting contractions starting this morning, every 25 minutes but getting closer together.  She reports the pain started low in her abdomen, is intermittent, but is now radiating to her low back. The intensity of the pain is the same since onset but they are becoming closer together. She tried resting and drinking fluids but it did not help. There are no associated symptoms.   She reports good fetal movement, denies LOF, vaginal bleeding, vaginal itching/burning, urinary symptoms, h/a, dizziness, n/v, or fever/chills.    HPI  Past Medical History: Past Medical History:  Diagnosis Date  . Hypertension     Past obstetric history: OB History  Gravida Para Term Preterm AB Living  SAB TAB Ectopic Multiple Live Births        0 1    # Outcome Date GA Lbr Len/2nd Weight Sex Delivery Anes PTL Lv  2 Current           1 Term 05/06/15 [redacted]w[redacted]d 23:06 / 01:56 5 lb 2.2 oz (2.33 kg) M Vag-Spont EPI  LIV     Birth Comments: SGA, IUGR, spent time in NICU    Obstetric Comments  Oligohydramnios    Past Surgical History: Past Surgical History:  Procedure Laterality Date  . NO PAST SURGERIES      Family History: Family History  Problem Relation Age of Onset  . Hypertension Mother     Social History: Social History  Substance Use Topics  . Smoking status: Former Smoker    Quit date: 03/2016  . Smokeless tobacco: Never Used  . Alcohol use No    Allergies: No Known Allergies  Meds:  Prescriptions Prior to Admission  Medication Sig Dispense Refill Last Dose  . acetaminophen (TYLENOL) 500 MG tablet Take 500 mg by mouth every 6 (six) hours as needed for mild pain or headache.   Past Week at Unknown time  . aspirin 81 MG chewable tablet Chew 1 tablet (81 mg total) by mouth daily. 30 tablet 12  10/19/2016 at Unknown time  . Prenat-FeAsp-Meth-FA-DHA w/o A (PRENATE PIXIE) 10-0.6-0.4-200 MG CAPS Take 1 tablet by mouth daily. 30 capsule 12 10/19/2016 at Unknown time  . ranitidine (ZANTAC) 150 MG tablet Take 1 tablet (150 mg total) by mouth 2 (two) times daily. (Patient taking differently: Take 150 mg by mouth 2 (two) times daily as needed for heartburn. ) 60 tablet 0 Past Week at Unknown time  . Vitamin D, Ergocalciferol, (DRISDOL) 50000 units CAPS capsule Take 1 capsule (50,000 Units total) by mouth every 7 (seven) days. (Patient taking differently: Take 50,000 Units by mouth every 7 (seven) days. wednesday) 30 capsule 2 10/18/2016    ROS:  Review of Systems  Constitutional: Negative for chills, fatigue and fever.  Eyes: Negative for visual disturbance.  Respiratory: Negative for shortness of breath.   Cardiovascular: Negative for chest pain.  Gastrointestinal: Positive for abdominal pain. Negative for nausea and vomiting.  Genitourinary: Positive for pelvic pain. Negative for difficulty urinating, dysuria, flank pain, vaginal bleeding, vaginal discharge and vaginal pain.  Musculoskeletal: Positive for back pain.  Neurological: Negative for dizziness and headaches.  Psychiatric/Behavioral: Negative.      I have reviewed patient's Past Medical Hx, Surgical Hx, Family Hx, Social Hx, medications and allergies.  Physical Exam  Patient Vitals for the past 24 hrs:  BP Temp Temp src Pulse Resp SpO2 Weight  10/20/16 1103 116/66 98 F (36.7 C) Oral (!) 103 17 99 % 167 lb 0.3 oz (75.8 kg)   Constitutional: Well-developed, well-nourished female in no acute distress.  Cardiovascular: normal rate Respiratory: normal effort GI: Abd soft, non-tender, gravid appropriate for gestational age.  MS: Extremities nontender, no edema, normal ROM Neurologic: Alert and oriented x 4.  GU: Neg CVAT.  PELVIC EXAM: Cervix pink, visually closed, without lesion, scant white creamy discharge, vaginal walls  and external genitalia normal Bimanual exam: Cervix 0/long/high, firm, anterior, neg CMT, uterus nontender, nonenlarged, adnexa without tenderness, enlargement, or mass  Dilation: 1 Effacement (%): Thick Cervical Position: Posterior Exam by:: Sharen Counter CNM  FHT:  Baseline 135 , moderate variability, accelerations present, no decelerations Contractions: q 3-4 mins, mild to palpation   Labs: Results for orders placed or performed during the hospital encounter of 10/20/16 (from the past 24 hour(s))  Urinalysis, Routine w reflex microscopic     Status: Abnormal   Collection Time: 10/20/16 11:06 AM  Result Value Ref Range   Color, Urine YELLOW YELLOW   APPearance HAZY (A) CLEAR   Specific Gravity, Urine 1.019 1.005 - 1.030   pH 6.0 5.0 - 8.0   Glucose, UA NEGATIVE NEGATIVE mg/dL   Hgb urine dipstick NEGATIVE NEGATIVE   Bilirubin Urine NEGATIVE NEGATIVE   Ketones, ur NEGATIVE NEGATIVE mg/dL   Protein, ur NEGATIVE NEGATIVE mg/dL   Nitrite NEGATIVE NEGATIVE   Leukocytes, UA MODERATE (A) NEGATIVE   RBC / HPF 0-5 0 - 5 RBC/hpf   WBC, UA 6-30 0 - 5 WBC/hpf   Bacteria, UA NONE SEEN NONE SEEN   Squamous Epithelial / LPF 6-30 (A) NONE SEEN   Mucus PRESENT    A/Positive/-- (05/16 1348)  Imaging:    MAU Course/MDM: I have ordered labs and reviewed results.  NST reviewed and reactive Procardia x 3 doses given Q 20 minutes with improvement in contractions Treatment for UTI started with first dose Keflex given in MAU today D/C home with preterm labor precautions, Keflex 500 mg QID x 7 days F/U in [facility] [place] office as scheduled Return to MAU as needed for emergencies Pt stable at time of discharge.  Today's evaluation included a work-up for preterm labor which can be life-threatening for both mom and baby.  Assessment:  1. UTI (urinary tract infection) during pregnancy, third trimester   2. Threatened preterm labor, third trimester    Plan: Discharge home Labor  precautions and fetal kick counts   Allergies as of 10/20/2016   No Known Allergies     Medication List    TAKE these medications   acetaminophen 500 MG tablet Commonly known as:  TYLENOL Take 500 mg by mouth every 6 (six) hours as needed for mild pain or headache.   aspirin 81 MG chewable tablet Chew 1 tablet (81 mg total) by mouth daily.   cephALEXin 500 MG capsule Commonly known as:  KEFLEX Take 1 capsule (500 mg total) by mouth 4 (four) times daily.   PRENATE PIXIE 10-0.6-0.4-200 MG Caps Take 1 tablet by mouth daily.   ranitidine 150 MG tablet Commonly known as:  ZANTAC Take 1 tablet (150 mg total) by mouth 2 (two) times daily. What changed:  when to take this  reasons to take this   Vitamin D (Ergocalciferol) 50000 units Caps capsule Commonly known as:  DRISDOL Take 1 capsule (50,000  Units total) by mouth every 7 (seven) days. What changed:  additional instructions            Discharge Care Instructions        Start     Ordered   10/20/16 0000  cephALEXin (KEFLEX) 500 MG capsule  4 times daily    Question:  Supervising Provider  Answer:  Adam Phenix   10/20/16 1452   10/20/16 0000  Discharge patient    Question Answer Comment  Discharge disposition 01-Home or Self Care   Discharge patient date 10/20/2016      10/20/16 1452      Sharen Counter Certified Nurse-Midwife 10/20/2016 12:57 PM

## 2016-10-20 NOTE — MAU Note (Signed)
Pt has appointment scheduled at 1500 for BPP in clinic, per provider pt can leave after getting antibiotic for appointment.

## 2016-10-20 NOTE — MAU Note (Signed)
Went to give Pt second dose of Procardia, Pt dropped in floor. Called pharmacy and they are sending another dose. Procardia was disposed of in sharps container.

## 2016-10-20 NOTE — MAU Note (Addendum)
+  contractions Started around 6am  Intermittent Every 20-25 minutes patient reports Rating pain 7/10  Denies vaginal bleeding or discharge  +FM  Has appointment today for BPP at 3pm

## 2016-10-21 LAB — CULTURE, OB URINE

## 2016-10-23 ENCOUNTER — Other Ambulatory Visit (HOSPITAL_COMMUNITY)
Admission: RE | Admit: 2016-10-23 | Discharge: 2016-10-23 | Disposition: A | Discharge status: Home / Self Care | Payer: Medicaid Other | Source: Ambulatory Visit | Attending: Certified Nurse Midwife | Admitting: Certified Nurse Midwife

## 2016-10-23 ENCOUNTER — Ambulatory Visit (INDEPENDENT_AMBULATORY_CARE_PROVIDER_SITE_OTHER): Payer: Medicaid Other | Admitting: Certified Nurse Midwife

## 2016-10-23 ENCOUNTER — Other Ambulatory Visit: Payer: Self-pay | Admitting: Advanced Practice Midwife

## 2016-10-23 ENCOUNTER — Telehealth: Payer: Self-pay

## 2016-10-23 VITALS — BP 127/81 | HR 94 | Wt 170.2 lb

## 2016-10-23 DIAGNOSIS — O0993 Supervision of high risk pregnancy, unspecified, third trimester: Secondary | ICD-10-CM

## 2016-10-23 DIAGNOSIS — R87612 Low grade squamous intraepithelial lesion on cytologic smear of cervix (LGSIL): Secondary | ICD-10-CM

## 2016-10-23 DIAGNOSIS — I10 Essential (primary) hypertension: Secondary | ICD-10-CM

## 2016-10-23 DIAGNOSIS — Z3A35 35 weeks gestation of pregnancy: Secondary | ICD-10-CM | POA: Diagnosis not present

## 2016-10-23 DIAGNOSIS — R7989 Other specified abnormal findings of blood chemistry: Secondary | ICD-10-CM

## 2016-10-23 DIAGNOSIS — O365930 Maternal care for other known or suspected poor fetal growth, third trimester, not applicable or unspecified: Secondary | ICD-10-CM | POA: Diagnosis not present

## 2016-10-23 DIAGNOSIS — O0990 Supervision of high risk pregnancy, unspecified, unspecified trimester: Secondary | ICD-10-CM

## 2016-10-23 DIAGNOSIS — E559 Vitamin D deficiency, unspecified: Secondary | ICD-10-CM

## 2016-10-23 MED ORDER — TERCONAZOLE 0.4 % VA CREA: 1.0000 | TOPICAL_CREAM | Freq: Every day | VAGINAL | 0 refills | Status: DC

## 2016-10-23 NOTE — Telephone Encounter (Signed)
No answer, unable to leave message as VM box is not set up.

## 2016-10-23 NOTE — Telephone Encounter (Signed)
-----   Message from Hurshel Party, CNM sent at 10/23/2016  3:44 AM EDT ----- I started treatment for this pt for UTI in MAU on 9/21 (with Keflex) but urine culture shows lactobacillus. She could complete Keflex but I recommend treatment for yeast infection in case this is causing her cramping/contractions.  I have sent Rx for Terazol 7 to pt pharmacy.  Please call to let her know. Thank you.

## 2016-10-23 NOTE — Progress Notes (Signed)
Patient reports good fetal movement, and contractions that come and go. Informed pt about terazol cream sent to pharmacy by provider.

## 2016-10-23 NOTE — Progress Notes (Signed)
   PRENATAL VISIT NOTE  Subjective:  Lynn Garcia is a 21 y.o. G2P1001 at [redacted]w[redacted]d being seen today for ongoing prenatal care.  She is currently monitored for the following issues for this high-risk pregnancy and has Chronic hypertension; Supervision of high risk pregnancy, antepartum; Low vitamin D level; LGSIL on Pap smear of cervix; and IUGR (intrauterine growth restriction) affecting care of mother on her problem list.  Patient reports no complaints.  Contractions: Irregular. Vag. Bleeding: None.  Movement: Present. Denies leaking of fluid.   The following portions of the patient's history were reviewed and updated as appropriate: allergies, current medications, past family history, past medical history, past social history, past surgical history and problem list. Problem list updated.  Objective:   Vitals:   10/23/16 1352  BP: 127/81  Pulse: 94  Weight: 170 lb 3.2 oz (77.2 kg)    Fetal Status: Fetal Heart Rate (bpm): NST; reactive Fundal Height: 35 cm Movement: Present  Presentation: Undeterminable  General:  Alert, oriented and cooperative. Patient is in no acute distress.  Skin: Skin is warm and dry. No rash noted.   Cardiovascular: Normal heart rate noted  Respiratory: Normal respiratory effort, no problems with respiration noted  Abdomen: Soft, gravid, appropriate for gestational age.  Pain/Pressure: Present     Pelvic: Cervical exam performed Dilation: 1 Effacement (%): 0 Station: Ballotable  Extremities: Normal range of motion.  Edema: Trace  Mental Status:  Normal mood and affect. Normal behavior. Normal judgment and thought content.  NST: + accels, no decels, moderate variability, Cat. 1 tracing. No contractions on toco.   Assessment and Plan:  Pregnancy: G2P1001 at [redacted]w[redacted]d  1. Poor fetal growth affecting management of mother in third trimester, single or unspecified fetus        2. LGSIL on Pap smear of cervix     Colpo postpartum  3. Low vitamin D level  Taking weekly vitamin D  4. Supervision of high risk pregnancy, antepartum      Doing well.  Reactive NST.  GBS/cultures today.   5. Chronic hypertension      Stable, not on meds  Preterm labor symptoms and general obstetric precautions including but not limited to vaginal bleeding, contractions, leaking of fluid and fetal movement were reviewed in detail with the patient. Please refer to After Visit Summary for other counseling recommendations.  Return in about 1 week (around 10/30/2016) for ROB, HOB.   Roe Coombs, CNM

## 2016-10-23 NOTE — Progress Notes (Signed)
Called patient, but was unable to leave message as her VM box is not set up yet.

## 2016-10-25 ENCOUNTER — Other Ambulatory Visit: Payer: Self-pay | Admitting: Certified Nurse Midwife

## 2016-10-25 DIAGNOSIS — B373 Candidiasis of vulva and vagina: Secondary | ICD-10-CM

## 2016-10-25 DIAGNOSIS — B3731 Acute candidiasis of vulva and vagina: Secondary | ICD-10-CM

## 2016-10-25 DIAGNOSIS — O0990 Supervision of high risk pregnancy, unspecified, unspecified trimester: Secondary | ICD-10-CM

## 2016-10-25 LAB — CERVICOVAGINAL ANCILLARY ONLY
Bacterial vaginitis: NEGATIVE
Candida vaginitis: POSITIVE — AB
Chlamydia: NEGATIVE
Neisseria Gonorrhea: NEGATIVE
Trichomonas: NEGATIVE

## 2016-10-25 LAB — STREP GP B NAA: Strep Gp B NAA: NEGATIVE

## 2016-10-25 MED ORDER — FLUCONAZOLE 150 MG PO TABS: 150.0000 mg | ORAL_TABLET | Freq: Once | ORAL | 0 refills | Status: AC

## 2016-10-25 MED ORDER — TERCONAZOLE 0.8 % VA CREA: 1.0000 | TOPICAL_CREAM | Freq: Every day | VAGINAL | 0 refills | Status: DC

## 2016-10-27 ENCOUNTER — Encounter (HOSPITAL_COMMUNITY): Payer: Self-pay

## 2016-10-27 ENCOUNTER — Inpatient Hospital Stay (HOSPITAL_COMMUNITY): Admission: RE | Admit: 2016-10-27 | Payer: Medicaid Other | Source: Ambulatory Visit

## 2016-10-30 ENCOUNTER — Ambulatory Visit (INDEPENDENT_AMBULATORY_CARE_PROVIDER_SITE_OTHER): Payer: Medicaid Other | Admitting: Certified Nurse Midwife

## 2016-10-30 VITALS — BP 141/80 | HR 78 | Wt 171.0 lb

## 2016-10-30 DIAGNOSIS — R7989 Other specified abnormal findings of blood chemistry: Secondary | ICD-10-CM

## 2016-10-30 DIAGNOSIS — O10913 Unspecified pre-existing hypertension complicating pregnancy, third trimester: Secondary | ICD-10-CM

## 2016-10-30 DIAGNOSIS — O0990 Supervision of high risk pregnancy, unspecified, unspecified trimester: Secondary | ICD-10-CM

## 2016-10-30 DIAGNOSIS — I10 Essential (primary) hypertension: Secondary | ICD-10-CM

## 2016-10-30 NOTE — Progress Notes (Signed)
   PRENATAL VISIT NOTE  Subjective:  Lynn Garcia is a 21 y.o. G2P1001 at [redacted]w[redacted]d being seen today for ongoing prenatal care.  She is currently monitored for the following issues for this high-risk pregnancy and has Chronic hypertension; Supervision of high risk pregnancy, antepartum; Low vitamin D level; LGSIL on Pap smear of cervix; and IUGR (intrauterine growth restriction) affecting care of mother on her problem list.  Patient reports no complaints.  Contractions: Irregular. Vag. Bleeding: None.  Movement: Present. Denies leaking of fluid.   The following portions of the patient's history were reviewed and updated as appropriate: allergies, current medications, past family history, past medical history, past social history, past surgical history and problem list. Problem list updated.  Objective:   Vitals:   10/30/16 1111  BP: (!) 141/80  Pulse: 78  Weight: 171 lb (77.6 kg)    Fetal Status: Fetal Heart Rate (bpm): NST; reactive Fundal Height: 36 cm Movement: Present     General:  Alert, oriented and cooperative. Patient is in no acute distress.  Skin: Skin is warm and dry. No rash noted.   Cardiovascular: Normal heart rate noted  Respiratory: Normal respiratory effort, no problems with respiration noted  Abdomen: Soft, gravid, appropriate for gestational age.  Pain/Pressure: Present     Pelvic: Cervical exam deferred        Extremities: Normal range of motion.     Mental Status:  Normal mood and affect. Normal behavior. Normal judgment and thought content.  NST: + accels, no decels, moderate variability, Cat. 1 tracing. Occasional contractions on toco.   Assessment and Plan:  Pregnancy: G2P1001 at [redacted]w[redacted]d  1. Chronic hypertension     Normotensive today.  Not on medications.  - Amniotic fluid index  2. Low vitamin D level     Taking weekly vitamin D.   3. Supervision of high risk pregnancy, antepartum     Reactive NST.  AFI: low-normal in office today.  Has BPP scheduled  for Friday 11/03/16.  AFI: total 6.77.    Preterm labor symptoms and general obstetric precautions including but not limited to vaginal bleeding, contractions, leaking of fluid and fetal movement were reviewed in detail with the patient. Please refer to After Visit Summary for other counseling recommendations.  Return in about 1 week (around 11/06/2016) for [facility], continue Bi-weekly Antenatal testing.   Roe Coombs, CNM

## 2016-10-31 ENCOUNTER — Encounter: Payer: Medicaid Other | Admitting: Certified Nurse Midwife

## 2016-11-03 ENCOUNTER — Encounter (HOSPITAL_COMMUNITY): Payer: Self-pay

## 2016-11-03 ENCOUNTER — Ambulatory Visit (HOSPITAL_COMMUNITY)
Admission: RE | Admit: 2016-11-03 | Discharge: 2016-11-03 | Disposition: A | Discharge status: Home / Self Care | Payer: Medicaid Other | Source: Ambulatory Visit | Attending: Certified Nurse Midwife | Admitting: Certified Nurse Midwife

## 2016-11-03 DIAGNOSIS — O10919 Unspecified pre-existing hypertension complicating pregnancy, unspecified trimester: Secondary | ICD-10-CM

## 2016-11-03 DIAGNOSIS — O10013 Pre-existing essential hypertension complicating pregnancy, third trimester: Secondary | ICD-10-CM | POA: Insufficient documentation

## 2016-11-03 DIAGNOSIS — O365930 Maternal care for other known or suspected poor fetal growth, third trimester, not applicable or unspecified: Secondary | ICD-10-CM | POA: Insufficient documentation

## 2016-11-03 DIAGNOSIS — O09293 Supervision of pregnancy with other poor reproductive or obstetric history, third trimester: Secondary | ICD-10-CM | POA: Insufficient documentation

## 2016-11-03 DIAGNOSIS — Z3A37 37 weeks gestation of pregnancy: Secondary | ICD-10-CM | POA: Diagnosis not present

## 2016-11-06 ENCOUNTER — Ambulatory Visit (INDEPENDENT_AMBULATORY_CARE_PROVIDER_SITE_OTHER): Payer: Medicaid Other | Admitting: Certified Nurse Midwife

## 2016-11-06 ENCOUNTER — Encounter: Payer: Self-pay | Admitting: *Deleted

## 2016-11-06 ENCOUNTER — Other Ambulatory Visit: Payer: Self-pay | Admitting: *Deleted

## 2016-11-06 VITALS — BP 112/75 | HR 75 | Wt 174.0 lb

## 2016-11-06 DIAGNOSIS — O0993 Supervision of high risk pregnancy, unspecified, third trimester: Secondary | ICD-10-CM | POA: Diagnosis not present

## 2016-11-06 DIAGNOSIS — Z3A37 37 weeks gestation of pregnancy: Secondary | ICD-10-CM

## 2016-11-06 DIAGNOSIS — O365930 Maternal care for other known or suspected poor fetal growth, third trimester, not applicable or unspecified: Secondary | ICD-10-CM

## 2016-11-06 DIAGNOSIS — O365933 Maternal care for other known or suspected poor fetal growth, third trimester, fetus 3: Secondary | ICD-10-CM

## 2016-11-06 DIAGNOSIS — O0990 Supervision of high risk pregnancy, unspecified, unspecified trimester: Secondary | ICD-10-CM

## 2016-11-06 DIAGNOSIS — Z3493 Encounter for supervision of normal pregnancy, unspecified, third trimester: Secondary | ICD-10-CM

## 2016-11-06 NOTE — Progress Notes (Signed)
Patient reports good fetal movement with pressure, denies contractions. 

## 2016-11-06 NOTE — Progress Notes (Signed)
   PRENATAL VISIT NOTE  Subjective:  Lynn Garcia is a 21 y.o. G2P1001 at [redacted]w[redacted]d being seen today for ongoing prenatal care.  She is currently monitored for the following issues for this high-risk pregnancy and has Chronic hypertension; Supervision of high risk pregnancy, antepartum; Low vitamin D level; LGSIL on Pap smear of cervix; and IUGR (intrauterine growth restriction) affecting care of mother on her problem list.  Patient reports no complaints.  Contractions: Not present. Vag. Bleeding: None.  Movement: Present. Denies leaking of fluid.   The following portions of the patient's history were reviewed and updated as appropriate: allergies, current medications, past family history, past medical history, past social history, past surgical history and problem list. Problem list updated.  Objective:   Vitals:   11/06/16 0856  BP: 112/75  Pulse: 75  Weight: 174 lb (78.9 kg)    Fetal Status: Fetal Heart Rate (bpm): NST; reactive   Movement: Present  Presentation: Vertex  General:  Alert, oriented and cooperative. Patient is in no acute distress.  Skin: Skin is warm and dry. No rash noted.   Cardiovascular: Normal heart rate noted  Respiratory: Normal respiratory effort, no problems with respiration noted  Abdomen: Soft, gravid, appropriate for gestational age.  Pain/Pressure: Present     Pelvic: Cervical exam performed Dilation: 1 Effacement (%): 0 Station: Ballotable  Extremities: Normal range of motion.  Edema: Trace  Mental Status:  Normal mood and affect. Normal behavior. Normal judgment and thought content.  NST: + accels, no decels, moderate variability, Cat. 1 tracing. Irregular contractions on toco.   Assessment and Plan:  Pregnancy: G2P1001 at [redacted]w[redacted]d  1. Supervision of high risk pregnancy, antepartum     Reactive NST.  IOL scheduled for 39 weeks d/t IUGR status. BPP 11/03/16: normal.  BPP ordered for later this week.   Term labor symptoms and general obstetric  precautions including but not limited to vaginal bleeding, contractions, leaking of fluid and fetal movement were reviewed in detail with the patient. Please refer to After Visit Summary for other counseling recommendations.  Return in about 1 week (around 11/13/2016) for [facility].   Roe Coombs, CNM

## 2016-11-06 NOTE — Addendum Note (Signed)
Addended by: Orvilla Cornwall A on: 11/06/2016 02:27 PM   Modules accepted: Orders

## 2016-11-06 NOTE — Progress Notes (Signed)
Correction made to u/s orders.

## 2016-11-07 ENCOUNTER — Telehealth (HOSPITAL_COMMUNITY): Payer: Self-pay | Admitting: *Deleted

## 2016-11-07 ENCOUNTER — Encounter (HOSPITAL_COMMUNITY): Payer: Self-pay | Admitting: *Deleted

## 2016-11-07 NOTE — Telephone Encounter (Signed)
Preadmission screen  

## 2016-11-10 ENCOUNTER — Ambulatory Visit (HOSPITAL_COMMUNITY)
Admission: RE | Admit: 2016-11-10 | Discharge: 2016-11-10 | Disposition: A | Discharge status: Home / Self Care | Payer: Medicaid Other | Source: Ambulatory Visit | Attending: Certified Nurse Midwife | Admitting: Certified Nurse Midwife

## 2016-11-10 ENCOUNTER — Encounter (HOSPITAL_COMMUNITY): Payer: Self-pay

## 2016-11-10 DIAGNOSIS — O09293 Supervision of pregnancy with other poor reproductive or obstetric history, third trimester: Secondary | ICD-10-CM | POA: Diagnosis not present

## 2016-11-10 DIAGNOSIS — O365930 Maternal care for other known or suspected poor fetal growth, third trimester, not applicable or unspecified: Secondary | ICD-10-CM | POA: Diagnosis not present

## 2016-11-10 DIAGNOSIS — Z3A38 38 weeks gestation of pregnancy: Secondary | ICD-10-CM | POA: Diagnosis not present

## 2016-11-10 DIAGNOSIS — O10919 Unspecified pre-existing hypertension complicating pregnancy, unspecified trimester: Secondary | ICD-10-CM

## 2016-11-10 DIAGNOSIS — O10913 Unspecified pre-existing hypertension complicating pregnancy, third trimester: Secondary | ICD-10-CM | POA: Diagnosis not present

## 2016-11-13 ENCOUNTER — Ambulatory Visit (INDEPENDENT_AMBULATORY_CARE_PROVIDER_SITE_OTHER): Payer: Medicaid Other | Admitting: Certified Nurse Midwife

## 2016-11-13 ENCOUNTER — Other Ambulatory Visit: Payer: Self-pay | Admitting: Obstetrics and Gynecology

## 2016-11-13 VITALS — BP 129/85 | HR 67 | Wt 176.8 lb

## 2016-11-13 DIAGNOSIS — O0993 Supervision of high risk pregnancy, unspecified, third trimester: Secondary | ICD-10-CM

## 2016-11-13 DIAGNOSIS — O0990 Supervision of high risk pregnancy, unspecified, unspecified trimester: Secondary | ICD-10-CM

## 2016-11-13 DIAGNOSIS — R7989 Other specified abnormal findings of blood chemistry: Secondary | ICD-10-CM

## 2016-11-13 DIAGNOSIS — I10 Essential (primary) hypertension: Secondary | ICD-10-CM

## 2016-11-13 NOTE — Progress Notes (Signed)
   PRENATAL VISIT NOTE  Subjective:  Lynn Garcia is a 21 y.o. G2P1001 at [redacted]w[redacted]d being seen today for ongoing prenatal care.  She is currently monitored for the following issues for this high-risk pregnancy and has Chronic hypertension; Supervision of high risk pregnancy, antepartum; Low vitamin D level; LGSIL on Pap smear of cervix; and IUGR (intrauterine growth restriction) affecting care of mother on her problem list.  Patient reports no complaints.  Contractions: Irregular. Vag. Bleeding: None.  Movement: Present. Denies leaking of fluid.   The following portions of the patient's history were reviewed and updated as appropriate: allergies, current medications, past family history, past medical history, past social history, past surgical history and problem list. Problem list updated.  Objective:   Vitals:   11/13/16 0858 11/13/16 0901  BP: 135/89 129/85  Pulse: 71 67  Weight: 176 lb 12.8 oz (80.2 kg)     Fetal Status: Fetal Heart Rate (bpm): NST; reactive Fundal Height: 38 cm Movement: Present     General:  Alert, oriented and cooperative. Patient is in no acute distress.  Skin: Skin is warm and dry. No rash noted.   Cardiovascular: Normal heart rate noted  Respiratory: Normal respiratory effort, no problems with respiration noted  Abdomen: Soft, gravid, appropriate for gestational age.  Pain/Pressure: Present     Pelvic: Cervical exam deferred        Extremities: Normal range of motion.  Edema: Trace  Mental Status:  Normal mood and affect. Normal behavior. Normal judgment and thought content.  NST: + accels, no decels, moderate variability, Cat. 1 tracing. No contractions on toco.   Assessment and Plan:  Pregnancy: G2P1001 at [redacted]w[redacted]d  1. Supervision of high risk pregnancy, antepartum      Has IOL scheduled for 11/17/16.  Reactive NST.   2. Low vitamin D level     Taking weekly vitamin D  3. Chronic hypertension     Normotensive today, completed baby ASA. Not on  medications.  - Fetal nonstress test; Future  Term labor symptoms and general obstetric precautions including but not limited to vaginal bleeding, contractions, leaking of fluid and fetal movement were reviewed in detail with the patient. Please refer to After Visit Summary for other counseling recommendations.  Return in about 4 weeks (around 12/11/2016) for NST Thursday,  4 week Postpartum exam.   Roe Coombs, CNM

## 2016-11-13 NOTE — Progress Notes (Signed)
Patient reports good fetal movement with pressure and denies contractions.

## 2016-11-16 ENCOUNTER — Encounter (HOSPITAL_COMMUNITY): Payer: Self-pay

## 2016-11-16 ENCOUNTER — Other Ambulatory Visit: Payer: Self-pay | Admitting: Certified Nurse Midwife

## 2016-11-16 ENCOUNTER — Ambulatory Visit (HOSPITAL_COMMUNITY)
Admission: RE | Admit: 2016-11-16 | Discharge: 2016-11-16 | Disposition: A | Discharge status: Home / Self Care | Payer: Medicaid Other | Source: Ambulatory Visit | Attending: Certified Nurse Midwife | Admitting: Certified Nurse Midwife

## 2016-11-16 DIAGNOSIS — O10919 Unspecified pre-existing hypertension complicating pregnancy, unspecified trimester: Secondary | ICD-10-CM

## 2016-11-16 DIAGNOSIS — O0993 Supervision of high risk pregnancy, unspecified, third trimester: Secondary | ICD-10-CM

## 2016-11-16 DIAGNOSIS — Z3493 Encounter for supervision of normal pregnancy, unspecified, third trimester: Secondary | ICD-10-CM

## 2016-11-16 DIAGNOSIS — IMO0002 Reserved for concepts with insufficient information to code with codable children: Secondary | ICD-10-CM

## 2016-11-16 DIAGNOSIS — O09299 Supervision of pregnancy with other poor reproductive or obstetric history, unspecified trimester: Secondary | ICD-10-CM

## 2016-11-16 DIAGNOSIS — Z3A38 38 weeks gestation of pregnancy: Secondary | ICD-10-CM

## 2016-11-16 DIAGNOSIS — O365930 Maternal care for other known or suspected poor fetal growth, third trimester, not applicable or unspecified: Secondary | ICD-10-CM

## 2016-11-17 ENCOUNTER — Inpatient Hospital Stay (HOSPITAL_COMMUNITY): Payer: Medicaid Other | Admitting: Anesthesiology

## 2016-11-17 ENCOUNTER — Inpatient Hospital Stay (HOSPITAL_COMMUNITY)
Admission: RE | Admit: 2016-11-17 | Discharge: 2016-11-20 | DRG: 807 | Disposition: A | Discharge status: Home / Self Care | Payer: Medicaid Other | Source: Ambulatory Visit | Attending: Obstetrics and Gynecology | Admitting: Obstetrics and Gynecology

## 2016-11-17 ENCOUNTER — Encounter (HOSPITAL_COMMUNITY): Payer: Self-pay

## 2016-11-17 DIAGNOSIS — Z87891 Personal history of nicotine dependence: Secondary | ICD-10-CM | POA: Diagnosis not present

## 2016-11-17 DIAGNOSIS — Z3A39 39 weeks gestation of pregnancy: Secondary | ICD-10-CM | POA: Diagnosis not present

## 2016-11-17 DIAGNOSIS — O1002 Pre-existing essential hypertension complicating childbirth: Principal | ICD-10-CM | POA: Diagnosis present

## 2016-11-17 DIAGNOSIS — R87612 Low grade squamous intraepithelial lesion on cytologic smear of cervix (LGSIL): Secondary | ICD-10-CM | POA: Diagnosis present

## 2016-11-17 DIAGNOSIS — Z7982 Long term (current) use of aspirin: Secondary | ICD-10-CM | POA: Diagnosis not present

## 2016-11-17 DIAGNOSIS — I10 Essential (primary) hypertension: Secondary | ICD-10-CM | POA: Diagnosis present

## 2016-11-17 DIAGNOSIS — O365930 Maternal care for other known or suspected poor fetal growth, third trimester, not applicable or unspecified: Secondary | ICD-10-CM

## 2016-11-17 LAB — COMPREHENSIVE METABOLIC PANEL
ALBUMIN: 2.8 g/dL — AB (ref 3.5–5.0)
ALT: 16 U/L (ref 14–54)
ANION GAP: 7 (ref 5–15)
AST: 22 U/L (ref 15–41)
Alkaline Phosphatase: 141 U/L — ABNORMAL HIGH (ref 38–126)
BUN: 7 mg/dL (ref 6–20)
CHLORIDE: 110 mmol/L (ref 101–111)
CO2: 18 mmol/L — AB (ref 22–32)
Calcium: 8.5 mg/dL — ABNORMAL LOW (ref 8.9–10.3)
Creatinine, Ser: 0.49 mg/dL (ref 0.44–1.00)
GFR calc Af Amer: >60 mL/min (ref >60)
GFR calc non Af Amer: >60 mL/min (ref >60)
Glucose, Bld: 72 mg/dL (ref 65–99)
POTASSIUM: 3.8 mmol/L (ref 3.5–5.1)
SODIUM: 135 mmol/L (ref 135–145)
Total Bilirubin: 0.1 mg/dL — ABNORMAL LOW (ref 0.3–1.2)
Total Protein: 6.3 g/dL — ABNORMAL LOW (ref 6.5–8.1)

## 2016-11-17 LAB — CBC
HCT: 37.3 % (ref 36.0–46.0)
HEMATOCRIT: 37.9 % (ref 36.0–46.0)
Hemoglobin: 12.8 g/dL (ref 12.0–15.0)
Hemoglobin: 12.6 g/dL (ref 12.0–15.0)
MCH: 30 pg (ref 26.0–34.0)
MCH: 30.3 pg (ref 26.0–34.0)
MCHC: 33.8 g/dL (ref 30.0–36.0)
MCHC: 33.8 g/dL (ref 30.0–36.0)
MCV: 89 fL (ref 78.0–100.0)
MCV: 89.7 fL (ref 78.0–100.0)
Platelets: 171 10*3/uL (ref 150–400)
Platelets: 145 10*3/uL — ABNORMAL LOW (ref 150–400)
RBC: 4.26 MIL/uL (ref 3.87–5.11)
RBC: 4.16 MIL/uL (ref 3.87–5.11)
RDW: 14.3 % (ref 11.5–15.5)
RDW: 14.4 % (ref 11.5–15.5)
WBC: 9.9 10*3/uL (ref 4.0–10.5)
WBC: 11.4 10*3/uL — AB (ref 4.0–10.5)

## 2016-11-17 LAB — PROTEIN / CREATININE RATIO, URINE
Creatinine, Urine: 70 mg/dL
PROTEIN CREATININE RATIO: 0.09 mg/mg{creat} (ref 0.00–0.15)
Total Protein, Urine: 6 mg/dL

## 2016-11-17 LAB — TYPE AND SCREEN
ABO/RH(D): A POS
ANTIBODY SCREEN: NEGATIVE

## 2016-11-17 LAB — RPR: RPR Ser Ql: NONREACTIVE

## 2016-11-17 MED ORDER — OXYTOCIN 40 UNITS IN LACTATED RINGERS INFUSION - SIMPLE MED
2.5000 [IU]/h | INTRAVENOUS | Status: DC
  Administered 2016-11-18: 2.5 [IU]/h via INTRAVENOUS

## 2016-11-17 MED ORDER — SOD CITRATE-CITRIC ACID 500-334 MG/5ML PO SOLN: 30.0000 mL | ORAL | Status: DC | PRN

## 2016-11-17 MED ORDER — HYDRALAZINE HCL 20 MG/ML IJ SOLN
10.0000 mg | Freq: Once | INTRAMUSCULAR | Status: DC | PRN
Start: 2016-11-17 — End: 2016-11-18

## 2016-11-17 MED ORDER — PHENYLEPHRINE 40 MCG/ML (10ML) SYRINGE FOR IV PUSH (FOR BLOOD PRESSURE SUPPORT)
80.0000 ug | PREFILLED_SYRINGE | INTRAVENOUS | Status: DC | PRN
Start: 2016-11-17 — End: 2016-11-18
  Filled 2016-11-17: qty 10

## 2016-11-17 MED ORDER — ONDANSETRON HCL 4 MG/2ML IJ SOLN: 4.0000 mg | Freq: Four times a day (QID) | INTRAMUSCULAR | Status: DC | PRN

## 2016-11-17 MED ORDER — MISOPROSTOL 50MCG HALF TABLET
50.0000 ug | ORAL_TABLET | ORAL | Status: DC | PRN
  Administered 2016-11-17: 50 ug via ORAL
  Filled 2016-11-17: qty 1

## 2016-11-17 MED ORDER — FENTANYL CITRATE (PF) 100 MCG/2ML IJ SOLN
100.0000 ug | INTRAMUSCULAR | Status: DC | PRN
  Administered 2016-11-17 (×3): 100 ug via INTRAVENOUS
  Filled 2016-11-17 (×3): qty 2

## 2016-11-17 MED ORDER — TERBUTALINE SULFATE 1 MG/ML IJ SOLN: 0.2500 mg | Freq: Once | INTRAMUSCULAR | Status: DC | PRN

## 2016-11-17 MED ORDER — FLEET ENEMA 7-19 GM/118ML RE ENEM: 1.0000 | ENEMA | RECTAL | Status: DC | PRN

## 2016-11-17 MED ORDER — PHENYLEPHRINE 40 MCG/ML (10ML) SYRINGE FOR IV PUSH (FOR BLOOD PRESSURE SUPPORT): 80.0000 ug | PREFILLED_SYRINGE | INTRAVENOUS | Status: DC | PRN

## 2016-11-17 MED ORDER — LIDOCAINE HCL (PF) 1 % IJ SOLN
INTRAMUSCULAR | Status: DC | PRN
  Administered 2016-11-17: 3 mL
  Administered 2016-11-17: 2 mL
  Administered 2016-11-17: 5 mL

## 2016-11-17 MED ORDER — LACTATED RINGERS IV SOLN: 500.0000 mL | Freq: Once | INTRAVENOUS | Status: DC

## 2016-11-17 MED ORDER — OXYTOCIN BOLUS FROM INFUSION
500.0000 mL | Freq: Once | INTRAVENOUS | Status: AC
  Administered 2016-11-18: 500 mL via INTRAVENOUS

## 2016-11-17 MED ORDER — DIPHENHYDRAMINE HCL 50 MG/ML IJ SOLN: 12.5000 mg | INTRAMUSCULAR | Status: DC | PRN

## 2016-11-17 MED ORDER — FENTANYL 2.5 MCG/ML BUPIVACAINE 1/10 % EPIDURAL INFUSION (WH - ANES)
14.0000 mL/h | INTRAMUSCULAR | Status: DC | PRN
Start: 2016-11-17 — End: 2016-11-18
  Administered 2016-11-17: 14 mL/h via EPIDURAL
  Filled 2016-11-17: qty 100

## 2016-11-17 MED ORDER — EPHEDRINE 5 MG/ML INJ: 10.0000 mg | INTRAVENOUS | Status: DC | PRN

## 2016-11-17 MED ORDER — LIDOCAINE HCL (PF) 1 % IJ SOLN
30.0000 mL | INTRAMUSCULAR | Status: DC | PRN
  Filled 2016-11-17: qty 30

## 2016-11-17 MED ORDER — OXYCODONE-ACETAMINOPHEN 5-325 MG PO TABS: 1.0000 | ORAL_TABLET | ORAL | Status: DC | PRN

## 2016-11-17 MED ORDER — OXYCODONE-ACETAMINOPHEN 5-325 MG PO TABS: 2.0000 | ORAL_TABLET | ORAL | Status: DC | PRN

## 2016-11-17 MED ORDER — LACTATED RINGERS IV SOLN
INTRAVENOUS | Status: DC
  Administered 2016-11-17 – 2016-11-18 (×4): via INTRAVENOUS

## 2016-11-17 MED ORDER — ACETAMINOPHEN 325 MG PO TABS: 650.0000 mg | ORAL_TABLET | ORAL | Status: DC | PRN

## 2016-11-17 MED ORDER — LABETALOL HCL 5 MG/ML IV SOLN: 20.0000 mg | INTRAVENOUS | Status: DC | PRN

## 2016-11-17 MED ORDER — OXYTOCIN 40 UNITS IN LACTATED RINGERS INFUSION - SIMPLE MED
1.0000 m[IU]/min | INTRAVENOUS | Status: DC
  Administered 2016-11-17: 2 m[IU]/min via INTRAVENOUS
  Filled 2016-11-17: qty 1000

## 2016-11-17 MED ORDER — LACTATED RINGERS IV SOLN: 500.0000 mL | INTRAVENOUS | Status: DC | PRN

## 2016-11-17 NOTE — Anesthesia Procedure Notes (Signed)
Epidural Patient location during procedure: OB Start time: 11/17/2016 10:45 PM End time: 11/17/2016 11:00 PM  Staffing Anesthesiologist: Marcene DuosFITZGERALD, Kimyetta Flott Performed: anesthesiologist   Preanesthetic Checklist Completed: patient identified, site marked, surgical consent, pre-op evaluation, timeout performed, IV checked, risks and benefits discussed and monitors and equipment checked  Epidural Patient position: sitting Prep: site prepped and draped and DuraPrep Patient monitoring: continuous pulse ox and blood pressure Approach: midline Location: L5-S1 Injection technique: LOR air  Needle:  Needle type: Tuohy  Needle gauge: 17 G Needle length: 9 cm and 9 Needle insertion depth: 5 cm and 7.5 cm Catheter type: closed end flexible Catheter size: 19 Gauge Catheter at skin depth: 14 (12.5-->14 when laid in lat decub position. ) cm Test dose: negative  Assessment Events: blood not aspirated, injection not painful, no injection resistance, negative IV test and no paresthesia

## 2016-11-17 NOTE — H&P (Signed)
OBSTETRIC ADMISSION HISTORY AND PHYSICAL  Lynn Garcia is a 21 y.o. female G2P1001 with IUP due to cHTN at [redacted]w[redacted]d by ultrasound on 05/27/16 presenting for IOL. She reports +FMs, No LOF, no VB, no blurry vision, headaches or peripheral edema, and RUQ pain.  She plans on breast & bottle feeding. She request Rutha Bouchard IUD for birth control. She received her prenatal care at [facility].  Dating: By Ultrasound (05/27/16) --->  Estimated Date of Delivery: 11/24/16  Sono:  11/03/16  @37w  0d, normal anatomy, Cephalic presentation, 2473g, 16% EFW  BPP: 11/16/16 (8/8)  Prenatal History/Complications: IUGR in pregnancy - resolved (currently 19% EFW) - SGA cHTN - not on medications jsut took ASA LGSIL  Past Medical History: Past Medical History:  Diagnosis Date  . Hypertension   . UTI (urinary tract infection)     Past Surgical History: Past Surgical History:  Procedure Laterality Date  . NO PAST SURGERIES      Obstetrical History: OB History    Gravida Para Term Preterm AB Living   2 1 1     1    SAB TAB Ectopic Multiple Live Births         0 1      Obstetric Comments   Oligohydramnios      Social History: Social History   Social History  . Marital status: Single    Spouse name: N/A  . Number of children: N/A  . Years of education: N/A   Social History Main Topics  . Smoking status: Former Smoker    Quit date: 03/2016  . Smokeless tobacco: Never Used  . Alcohol use No  . Drug use: No  . Sexual activity: Yes    Birth control/ protection: None   Other Topics Concern  . None   Social History Narrative  . None    Family History: Family History  Problem Relation Age of Onset  . Hypertension Mother     Allergies: No Known Allergies  Prescriptions Prior to Admission  Medication Sig Dispense Refill Last Dose  . acetaminophen (TYLENOL) 500 MG tablet Take 500 mg by mouth every 6 (six) hours as needed for mild pain or headache.   Taking  . aspirin 81 MG chewable  tablet Chew 1 tablet (81 mg total) by mouth daily. 30 tablet 12 Taking  . cephALEXin (KEFLEX) 500 MG capsule Take 500 mg by mouth 4 (four) times daily.   Not Taking  . Prenat-FeAsp-Meth-FA-DHA w/o A (PRENATE PIXIE) 10-0.6-0.4-200 MG CAPS Take 1 tablet by mouth daily. 30 capsule 12 Taking  . Vitamin D, Ergocalciferol, (DRISDOL) 50000 units CAPS capsule Take 1 capsule (50,000 Units total) by mouth every 7 (seven) days. 30 capsule 2 Taking     Review of Systems   All systems reviewed and negative except as stated in HPI  Blood pressure (!) 146/103, pulse 66, temperature 97.8 F (36.6 C), temperature source Oral, resp. rate (P) 20, height 5\' 1"  (1.549 m), weight 176 lb (79.8 kg), last menstrual period 02/18/2016, not currently breastfeeding. General appearance: alert, cooperative and appears stated age Lungs: clear to auscultation bilaterally Heart: regular rate and rhythm Abdomen: soft, non-tender; bowel sounds normal Extremities: Homans sign is negative, no sign of DVT Presentation: cephalic Fetal monitoringBaseline: 140s bpm, Variability: Good {> 6 bpm) and Accelerations: Reactive Uterine activityFrequency: Every 4 minutes, Duration: 60 seconds and Intensity: mild Dilation: (P) 1.5 Effacement (%): (P) 50 Station: (P) Ballotable Exam by:: (P) Lynn Garcia   Prenatal labs: ABO, Rh: --/--/A POS (10/19 1096) Antibody:  NEG (10/19 [id number]) Rubella: 5.11 (05/16 1348) RPR: Non Reactive (07/25 1100)  HBsAg: Negative (05/16 1348)  HIV:   Non-Reactive GBS: Negative (09/24 1502)   2 hr Glucola:  Fasting 79, 1hr 128, 2hr 92 Genetic screening  AFP: neg, NIP: mat21:normal Anatomy US normal female fetus  Prenatal Transfer Tool  Maternal Diabetes: No Genetic Screening: Normal Maternal Ultrasounds/Referrals: Normal Fetal Ultrasounds or other Referrals:  None Maternal Substance Abuse:  No Significant Maternal Medications:  Meds include: Other: vitamin D weekly Significant Maternal Lab Results: Lab  values include: Group B Strep negative, Other: Low vitamin D level 06/19/16 @9 .8  Results for orders placed or performed during the hospital encounter of 11/17/16 (from the past 24 hour(s))  CBC   Collection Time: 11/17/16  8:14 AM  Result Value Ref Range   WBC 9.9 4.0 - 10.5 K/uL   RBC 4.26 3.87 - 5.11 MIL/uL   Hemoglobin 12.8 12.0 - 15.0 g/dL   HCT 69.637.9 29.536.0 - 28.446.0 %   MCV 89.0 78.0 - 100.0 fL   MCH 30.0 26.0 - 34.0 pg   MCHC 33.8 30.0 - 36.0 g/dL   RDW 13.214.3 44.011.5 - 10.215.5 %   Platelets 171 150 - 400 K/uL  Type and screen   Collection Time: 11/17/16  8:14 AM  Result Value Ref Range   ABO/RH(D) A POS    Antibody Screen NEG    Sample Expiration 11/20/2016     Patient Active Problem List   Diagnosis Date Noted  . Labor and delivery indication for care or intervention 11/17/2016  . IUGR (intrauterine growth restriction) affecting care of mother 10/04/2016  . 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 Low vitamin D level 06/19/2016  . Supervision of high risk pregnancy, antepartum 06/14/2016  . Chronic hypertension 05/05/2015    Assessment/Plan:  [name] is a 21 y.o. G2P1001 at 303w0d here for IOL for cHTN.  #Labor: Induction per protocol. FB placed. Contracting too often for Cytotec.  #CHTN: elevated BP on admission. No PIH symptoms. Labs ordered.  #Pain: per patient request; plan for epidural.  #FWB: Cat 1 #ID: GBS neg #MOF: Breast and bottle #MOC: Kyleena IUD #Circ:  N/A (girl)  Suella BroadKeriann S Minott, MD  11/17/2016, 9:28 AM PGY-1  OB FELLOW HISTORY AND PHYSICAL ATTESTATION  I confirm that I have verified the information documented in the resident's note and that I have also personally reperformed the physical exam and all medical decision making activities. I agree with above documentation and have made edits as needed.   Caryl AdaJazma Breeley Bischof, DO OB Fellow 11/17/2016, 10:11 AM

## 2016-11-17 NOTE — Anesthesia Preprocedure Evaluation (Signed)
Anesthesia Evaluation  Patient identified by MRN, date of birth, ID band Patient awake    Reviewed: Allergy & Precautions, NPO status , Patient's Chart, lab work & pertinent test results  Airway Mallampati: II  TM Distance: >3 FB     Dental   Pulmonary former smoker,    breath sounds clear to auscultation       Cardiovascular hypertension,  Rhythm:Regular Rate:Normal     Neuro/Psych negative neurological ROS     GI/Hepatic negative GI ROS, Neg liver ROS,   Endo/Other  negative endocrine ROS  Renal/GU negative Renal ROS     Musculoskeletal   Abdominal   Peds  Hematology negative hematology ROS (+)   Anesthesia Other Findings   Reproductive/Obstetrics (+) Pregnancy                             Lab Results  Component Value Date   WBC 11.4 (H) 11/17/2016   HGB 12.6 11/17/2016   HCT 37.3 11/17/2016   MCV 89.7 11/17/2016   PLT 145 (L) 11/17/2016    Anesthesia Physical Anesthesia Plan  ASA: II  Anesthesia Plan: Epidural   Post-op Pain Management:    Induction:   PONV Risk Score and Plan: Treatment may vary due to age or medical condition  Airway Management Planned: Natural Airway  Additional Equipment:   Intra-op Plan:   Post-operative Plan:   Informed Consent: I have reviewed the patients History and Physical, chart, labs and discussed the procedure including the risks, benefits and alternatives for the proposed anesthesia with the patient or authorized representative who has indicated his/her understanding and acceptance.     Plan Discussed with:   Anesthesia Plan Comments:         Anesthesia Quick Evaluation

## 2016-11-17 NOTE — Progress Notes (Addendum)
Labor Progress Note Lynn Garcia is a 21 y.o. G2P1001 at [redacted]w[redacted]d presented for IOL for cHTN. S: Pt reports that she feels the contractions increasing in intensity, but other than that she feels fine. No complaints.  O:  BP 136/74   Pulse 65   Temp 97.9 F (36.6 C) (Oral)   Resp 20   Ht 5' 1" (1.549 m)   Wt 176 lb (79.8 kg)   LMP 02/18/2016 (Approximate)   BMI 33.25 kg/m  EFM: 130/mod vari/ +accels no decels  CVE: Dilation: 1.5 Effacement (%): 50 Cervical Position: Middle Station: Ballotable Presentation: Vertex Exam by:: phelps   A&P: 21 y.o. G2P1001 [redacted]w[redacted]d presented for IOL for cHTN. #Labor: foley bulb in place (starting cytotec due to spacing of contractions #Pain: manageable (medication per patient request) #FWB: cat 1 #GBS negative #cHTN (BP controled 120-136 systolic/71-77diastolic in last 4 hr);  Cbc and cmp wnl (plt 171) (ast/alt 22/16 respectively with Alk phos 141). Pr/c ratio (0.09)  Keriann S Minott, MD 1:24 PM  

## 2016-11-17 NOTE — Anesthesia Pain Management Evaluation Note (Signed)
  CRNA Pain Management Visit Note  Patient: Lynn GoodHeroinesha Garcia, 21 y.o., female  "Hello I am a member of the anesthesia team at [facility]. We have an anesthesia team available at all times to provide care throughout the hospital, including epidural management and anesthesia for C-section. I don't know your plan for the delivery whether it a natural birth, water birth, IV sedation, nitrous supplementation, doula or epidural, but we want to meet your pain goals."   1.Was your pain managed to your expectations on prior hospitalizations?   Yes   2.What is your expectation for pain management during this hospitalization?     Epidural  3.How can we help you reach that goal? epidural  Record the patient's initial score and the patient's pain goal.   Pain: 5  Pain Goal: 8 [facility] wants you to be able to say your pain was always managed very well.  Rhonda Vangieson 11/17/2016

## 2016-11-17 NOTE — Progress Notes (Signed)
Pt now comfortable with epidural. SVE 5/70/-3. AROM with clear fluid.  FHT cat I  Kandra NicolasJulie P. Chantel Teti, MD OB Fellow

## 2016-11-17 NOTE — Progress Notes (Signed)
Labor Progress Note Lynn Garcia is a 21 y.o. G2P1001 at 32w0dpresented for presented for IOL for cHTN. S: Pt reports that she continues to feel increase intensity of contractions. She has had 2 doses of pain medication and is doing well.  O:  BP (!) 144/84   Pulse 75   Temp 97.8 F (36.6 C) (Axillary)   Resp 18   Ht '5\' 1"'  (1.549 m)   Wt 176 lb (79.8 kg)   LMP 02/18/2016 (Approximate)   BMI 33.25 kg/m  EFM: 120s/mod vari/ +accels, some decels  CVE: Dilation: 4.5 Effacement (%): 70 Cervical Position: Posterior Station: -3 Presentation: Vertex Exam by:: dr pGerarda Fraction  A&P: 21y.o. G2P1001 341w0dresented for IOL for cHTN. #Labor: Progressing well. Foley bulb out and patient was started on pit. #Pain: manageable (medication per patient request) #FWB: cat 1 #GBS negative #cHTN (BP controlled [id number]/49-44iastolic in last 4 hr);  Cbc and cmp wnl (plt 171) (ast/alt 22/16 respectively with Alk phos 141). Pr/c ratio (0.09)  KeOvidio KinMD 6:08 PM

## 2016-11-17 NOTE — Progress Notes (Signed)
Lynn Garcia is a 21 y.o. G2P1001 at 3117w0d admitted for induction of labor due to Hypertension.  Subjective:   Objective: BP 132/81   Pulse 72   Temp (!) 97.5 F (36.4 C) (Axillary)   Resp 18   Ht 5\' 1"  (1.549 m)   Wt 176 lb (79.8 kg)   LMP 02/18/2016 (Approximate)   BMI 33.25 kg/m  No intake/output data recorded. No intake/output data recorded.  FHT:  FHR: 125 bpm, variability: moderate,  accelerations:  Present,  decelerations:  Absent UC:   irregular, every 2-3 minutes SVE:   Dilation: 4.5 Effacement (%): 70 Station: -3 Exam by:: dr Doroteo Glassmanphelps  Labs: Lab Results  Component Value Date   WBC 9.9 11/17/2016   HGB 12.8 11/17/2016   HCT 37.9 11/17/2016   MCV 89.0 11/17/2016   PLT 171 11/17/2016    Assessment / Plan: Induction of labor due to cHTN,  progressing well on pitocin  Labor: Progressing on Pitocin, will continue to increase then AROM Preeclampsia:  no signs or symptoms of toxicity Fetal Wellbeing:  Category I Pain Control:  Epidural I/D:  n/a Anticipated MOD:  NSVD  Lynn Garcia 11/17/2016, 10:20 PM

## 2016-11-18 ENCOUNTER — Encounter (HOSPITAL_COMMUNITY): Payer: Self-pay

## 2016-11-18 DIAGNOSIS — O1002 Pre-existing essential hypertension complicating childbirth: Secondary | ICD-10-CM

## 2016-11-18 DIAGNOSIS — Z3A39 39 weeks gestation of pregnancy: Secondary | ICD-10-CM

## 2016-11-18 MED ORDER — DIBUCAINE 1 % RE OINT: 1.0000 "application " | TOPICAL_OINTMENT | RECTAL | Status: DC | PRN

## 2016-11-18 MED ORDER — WITCH HAZEL-GLYCERIN EX PADS: 1.0000 "application " | MEDICATED_PAD | CUTANEOUS | Status: DC | PRN

## 2016-11-18 MED ORDER — COCONUT OIL OIL
1.0000 "application " | TOPICAL_OIL | Status: DC | PRN
  Administered 2016-11-19: 1 via TOPICAL
  Filled 2016-11-18: qty 120

## 2016-11-18 MED ORDER — PRENATAL MULTIVITAMIN CH
1.0000 | ORAL_TABLET | Freq: Every day | ORAL | Status: DC
  Administered 2016-11-18 – 2016-11-20 (×3): 1 via ORAL
  Filled 2016-11-18 (×3): qty 1

## 2016-11-18 MED ORDER — BENZOCAINE-MENTHOL 20-0.5 % EX AERO: 1.0000 "application " | INHALATION_SPRAY | CUTANEOUS | Status: DC | PRN

## 2016-11-18 MED ORDER — SENNOSIDES-DOCUSATE SODIUM 8.6-50 MG PO TABS
2.0000 | ORAL_TABLET | ORAL | Status: DC
  Administered 2016-11-19 (×2): 2 via ORAL
  Filled 2016-11-18 (×2): qty 2

## 2016-11-18 MED ORDER — ACETAMINOPHEN 325 MG PO TABS: 650.0000 mg | ORAL_TABLET | ORAL | Status: DC | PRN

## 2016-11-18 MED ORDER — DIPHENHYDRAMINE HCL 25 MG PO CAPS: 25.0000 mg | ORAL_CAPSULE | Freq: Four times a day (QID) | ORAL | Status: DC | PRN

## 2016-11-18 MED ORDER — SIMETHICONE 80 MG PO CHEW: 80.0000 mg | CHEWABLE_TABLET | ORAL | Status: DC | PRN

## 2016-11-18 MED ORDER — IBUPROFEN 600 MG PO TABS
600.0000 mg | ORAL_TABLET | Freq: Four times a day (QID) | ORAL | Status: DC
  Administered 2016-11-18 – 2016-11-20 (×9): 600 mg via ORAL
  Filled 2016-11-18 (×10): qty 1

## 2016-11-18 MED ORDER — ONDANSETRON HCL 4 MG PO TABS: 4.0000 mg | ORAL_TABLET | ORAL | Status: DC | PRN

## 2016-11-18 MED ORDER — ZOLPIDEM TARTRATE 5 MG PO TABS
5.0000 mg | ORAL_TABLET | Freq: Every evening | ORAL | Status: DC | PRN
Start: 2016-11-18 — End: 2016-11-20

## 2016-11-18 MED ORDER — TETANUS-DIPHTH-ACELL PERTUSSIS 5-2.5-18.5 LF-MCG/0.5 IM SUSP: 0.5000 mL | Freq: Once | INTRAMUSCULAR | Status: DC

## 2016-11-18 MED ORDER — LACTATED RINGERS IV SOLN
INTRAVENOUS | Status: DC
  Administered 2016-11-18: 01:00:00 via INTRAUTERINE

## 2016-11-18 MED ORDER — ONDANSETRON HCL 4 MG/2ML IJ SOLN: 4.0000 mg | INTRAMUSCULAR | Status: DC | PRN

## 2016-11-18 NOTE — Progress Notes (Signed)
LABOR PROGRESS NOTE  Pauline GoodHeroinesha Louvier is a 21 y.o. G2P1001 at 1040w1d  admitted for IOL for chronic HTN  Subjective: Feeling some pressure now.   RN reports: has reposition patient a few times d/t recurrent variables since AROM  Objective: BP (!) 147/92   Pulse 72   Temp 97.7 F (36.5 C) (Axillary)   Resp 16   Ht 5\' 1"  (1.549 m)   Wt 176 lb (79.8 kg)   LMP 02/18/2016 (Approximate)   BMI 33.25 kg/m  or  Vitals:   11/17/16 2316 11/17/16 2321 11/17/16 2326 11/17/16 2331  BP: (!) 146/92 (!) 147/86 138/90 (!) 147/92  Pulse: 73 66 66 72  Resp:   16   Temp:    97.7 F (36.5 C)  TempSrc:    Axillary  Weight:      Height:        SVE: 6-7 cm/90%/-2 FHT: baseline rate 120, moderate varibility, +acel, variable decels Toco: ctx q2-4 min   Assessment / Plan: 21 y.o. G2P1001 at 5140w1d here for IOL for cHTN.   Labor: Progressing well on IV Pit and s/p AROM at 23:30 Fetal Wellbeing:  Cat II, recurrent variables since AROM. Will start amnioinfusion Pain Control:  Epidural Anticipated MOD:  SVD  Frederik PearJulie P Degele, MD 11/18/2016, 12:56 AM

## 2016-11-18 NOTE — Lactation Note (Signed)
This note was copied from a baby's chart. Lactation Consultation Note  Patient Name: Lynn Pauline GoodHeroinesha Bencomo ZOXWR'UToday's Date: 11/18/2016 Reason for consult: Follow-up assessment;Infant < 6lbs Infant is 5711 hours old & seen by Lactation for follow-up assessment. RN called LC stating that they had been working on BF but that baby would not maintain latch and when sucking on a gloved finger, baby would curl her tongue; RN also stated that baby spit up a lot of fluid earlier and has had multiple wet & stool dipaers. Baby was skin-to-skin with mom when LC entered. Mom reports she did not BF with her first (who is 7590yr now) because he had to go to the NICU and she had no way to bring the milk to the NICU but that she had a lot of milk & it took awhile to go away. Mom reports she would like to provide only her milk if possible for at least the first 2-3 months. Discussed importance of stimulation & option of pumping. Mom reports she would like to pump. LC set up DEBP- showed mom how to use & clean it. Mom pumped on Initiate setting and then showed mom how to hand express afterwards and mom got ~415mL from both breasts (from pumping & hand expressing). Discussed milk storage guidelines.  Encouraged mom to latch baby 8-12 times/24 hours and with feeding cues and to pump & hand express afterwards if baby does not latch well and feed baby any milk she pumps and/ or formula if baby did not latch long. Baby placed back skin-to-skin with mom. Mom reports no questions at this time. Encouraged mom to ask for help as needed.   Maternal Data Has patient been taught Hand Expression?: No (baby and mother resting) Does the patient have breastfeeding experience prior to this delivery?: No  Feeding Feeding Type: Breast Fed Nipple Type: Slow - flow Length of feed: 0 min  LATCH Score Latch: Too sleepy or reluctant, no latch achieved, no sucking elicited.  Audible Swallowing: None  Type of Nipple: Everted at rest and after  stimulation  Comfort (Breast/Nipple): Soft / non-tender  Hold (Positioning): Assistance needed to correctly position infant at breast and maintain latch.  LATCH Score: 5  Interventions Interventions: Breast feeding basics reviewed;Hand express;Skin to skin;DEBP  Lactation Tools Discussed/Used WIC Program: Yes Pump Review: Setup, frequency, and cleaning;Milk Storage   Consult Status Consult Status: Follow-up Date: 11/19/16 Follow-up type: In-patient    Oneal GroutLaura C Jonahtan Manseau 11/18/2016, 4:44 PM

## 2016-11-18 NOTE — Lactation Note (Signed)
This note was copied from a baby's chart. Lactation Consultation Note  Patient Name: Lynn Garcia XBJYN'WToday's Date: 11/18/2016 Reason for consult: Initial assessment   P2, Baby 10 hours old.  Mother and baby resting. She did not breastfeed her first child. Spoke with mother briefly.  She wants to offer breast and formula. Encouraged mother to breastfeed before offering formula to help her establish her milk supply. Mom encouraged to feed baby 8-12 times/24 hours and with feeding cues at least q 3 hours.  Mom made aware of O/P services, breastfeeding support groups, community resources, and our phone # for post-discharge questions.  Suggest mother call if she would like assistance w/ breastfeeding.     Maternal Data Has patient been taught Hand Expression?: No (baby and mother resting) Does the patient have breastfeeding experience prior to this delivery?: No  Feeding Feeding Type: Formula Nipple Type: Slow - flow Length of feed: 20 min (not sucking well per MOB)  LATCH Score                   Interventions    Lactation Tools Discussed/Used     Consult Status Consult Status: Follow-up Date: 11/19/16 Follow-up type: In-patient    Dahlia ByesBerkelhammer, [facility] 11/18/2016, 2:49 PM

## 2016-11-18 NOTE — Progress Notes (Signed)
Persistent deep variables. Will d/c Pit

## 2016-11-18 NOTE — Anesthesia Postprocedure Evaluation (Signed)
Anesthesia Post Note  Patient: Lynn GoodHeroinesha Garcia  Procedure(s) Performed: AN AD HOC LABOR EPIDURAL     Patient location during evaluation: Mother Baby Anesthesia Type: Epidural Level of consciousness: awake Pain management: satisfactory to patient Vital Signs Assessment: post-procedure vital signs reviewed and stable Respiratory status: spontaneous breathing Cardiovascular status: stable Anesthetic complications: no    Last Vitals:  Vitals:   11/18/16 0546 11/18/16 0637  BP: 139/83 (!) 141/90  Pulse: 78 66  Resp:  18  Temp:  37.3 C    Last Pain:  Vitals:   11/18/16 0637  TempSrc: Oral  PainSc: 0-No pain   Pain Goal:                 Cephus ShellingBURGER,Charnette Younkin

## 2016-11-18 NOTE — Plan of Care (Signed)
Problem: Activity: Goal: Ability to tolerate increased activity will improve Outcome: Progressing Pt movement is improving.  Pt reports sensation is slowly returning to her legs.  She demonstrates an ability to lift her hips.  Pt instructed not to get up without staff to assist. Pt verbalized understanding.

## 2016-11-19 NOTE — Lactation Note (Signed)
This note was copied from a baby's chart. Lactation Consultation Note Mom has been BF then offering Gerber formula. Mom stated the most baby takes is 10 ml, that she doesn't like the bottles, baby prefers breast. Gave mom supplementing feeding sheet and reviewed information. Encouraged giving BM /colostrum before formula.  Mom latching baby to breast when LC entered rm in laid back position. Made suggestions to adjust latch. Asked mom if latch hurting, mom stated yes. Offered assistance in latching. Widened flange, turned baby's head facing mom d/t baby's head turning to side, pulling on nipple. Discussed feeding position options.  Noted DEBP at bedside. Asked mom about pumping. Mom stated she hadn't pumped since yesterday, (10/20). Stated baby had been BF a lot, and mom is supplementing w/Gerber formula, hadn't really wanted to pump. Mom stated she had been giving colostrum when pumped or hand expressed. Feedings not updated. Stressed importance of strict I&O.  MD keep baby under care another day for feedings.  Encouraged mom to call for assistance if needed or has questions.  Patient Name: Girl Pauline GoodHeroinesha Colasurdo ZOXWR'UToday's Date: 11/19/2016 Reason for consult: Follow-up assessment;Infant < 6lbs   Maternal Data    Feeding Feeding Type: Breast Fed Length of feed: 10 min (still BF)  LATCH Score Latch: Grasps breast easily, tongue down, lips flanged, rhythmical sucking.  Audible Swallowing: A few with stimulation  Type of Nipple: Everted at rest and after stimulation  Comfort (Breast/Nipple): Filling, red/small blisters or bruises, mild/mod discomfort  Hold (Positioning): Assistance needed to correctly position infant at breast and maintain latch.  LATCH Score: 7  Interventions Interventions: Breast feeding basics reviewed;Breast compression;Adjust position;Assisted with latch;Support pillows;Breast massage;Position options;Hand express;DEBP  Lactation Tools Discussed/Used Tools:  Pump Breast pump type: Double-Electric Breast Pump   Consult Status Consult Status: Follow-up Date: 11/19/16 Follow-up type: In-patient    Charyl DancerCARVER, Mekaylah Klich G 11/19/2016, 8:28 AM

## 2016-11-19 NOTE — Progress Notes (Signed)
MOB was referred for history of depression/anxiety.  Referral is screened out by Clinical Social Worker because none of the following criteria appear to apply and there are no reports impacting the pregnancy or her transition to the postpartum period.  CSW does not deem it clinically necessary to further investigate at this time.   -History of anxiety/depression during this pregnancy, or of post-partum depression. - Diagnosis of anxiety and/or depression within last 3 years  - History of depression due to pregnancy loss/loss of child or -MOB's symptoms are currently being treated with medication and/or therapy.  CSW completed chart review and [facility] the [facility] :   "Patient denies any current symptoms of depression or anxiety , took Vyvance,a few years ago."   Please contact the Clinical Social Worker if needs arise or upon MOB request.    Lynn Garcia, MSW, LCSW-A Clinical Social Worker  [facility]  Office: [phone]

## 2016-11-19 NOTE — Progress Notes (Signed)
Post Partum Day 1 Subjective: no complaints, up ad lib, voiding and tolerating PO  Objective: Blood pressure 131/87, pulse 82, temperature 98.7 F (37.1 C), temperature source Oral, resp. rate 18, height 5\' 1"  (1.549 m), weight 176 lb (79.8 kg), last menstrual period 02/18/2016, SpO2 100 %, unknown if currently breastfeeding.  Physical Exam:  General: alert, cooperative and no distress Lochia: appropriate Uterine Fundus: firm Incision: n/a DVT Evaluation: No evidence of DVT seen on physical exam. No cords or calf tenderness.   Recent Labs  11/17/16 0814 11/17/16 2214  HGB 12.8 12.6  HCT 37.9 37.3    Assessment/Plan: Plan for discharge tomorrow, Breastfeeding and Contraception IUD   LOS: 2 days   Arlyce Harmanimothy Lockamy 11/19/2016, 8:53 AM   OB FELLOW POSTPARTUM PROGRESS NOTE ATTESTATION  I have seen and examined this patient and agree with above documentation in the resident's note.   Frederik PearJulie P Degele, MD OB Fellow 8:54 AM

## 2016-11-20 MED ORDER — IBUPROFEN 600 MG PO TABS: 600.0000 mg | ORAL_TABLET | Freq: Four times a day (QID) | ORAL | 0 refills | Status: DC | PRN

## 2016-11-20 MED ORDER — AMLODIPINE BESYLATE 5 MG PO TABS: 5.0000 mg | ORAL_TABLET | Freq: Every day | ORAL | 1 refills | Status: DC

## 2016-11-20 MED ORDER — SENNOSIDES-DOCUSATE SODIUM 8.6-50 MG PO TABS: 2.0000 | ORAL_TABLET | Freq: Every evening | ORAL | 0 refills | Status: DC | PRN

## 2016-11-20 NOTE — Discharge Summary (Signed)
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 yo [id number] at [redacted]w[redacted]d was admitted to the hospital 11/17/2016 for induction of labor.  Indication for induction: chronic HTN.  Patient had an uncomplicated labor course as follows: Membrane Rupture Time/Date: 11:34 PM ,11/17/2016   Intrapartum Procedures: Episiotomy: None [1]                                         Lacerations:  None [1]  Patient had delivery of a Viable infant.  Information for the patient's newborn:  Braley, Luckenbaugh [[id number]]  Delivery Method: Vaginal, Spontaneous Delivery (Filed from Delivery Summary)   11/18/2016  Details of delivery can be found in separate delivery note.  Patient had a routine postpartum course. Patient is discharged home 11/20/16.  Physical exam  Vitals:   11/19/16 0500 11/19/16 1900 11/19/16 2300 11/20/16 0602  BP: 131/87 139/82 (!) 135/93 (!) 144/89  Pulse: 82 71 69 68  Resp: 18 20 18 18   Temp: 98.7 F (37.1 C) 97.7 F (36.5 C) 98.3 F (36.8 C) 98.4 F (36.9 C)  TempSrc: Oral Oral Oral Oral  SpO2:   100%   Weight:      Height:       General: alert, cooperative and no distress Lochia: appropriate Uterine Fundus: firm Incision: N/A DVT Evaluation: No evidence  of DVT seen on physical exam. No cords or calf tenderness. Labs: Lab Results  Component Value Date   WBC 11.4 (H) 11/17/2016   HGB 12.6 11/17/2016   HCT 37.3 11/17/2016   MCV 89.7 11/17/2016   PLT 145 (L) 11/17/2016   CMP Latest Ref Rng & Units 11/17/2016  Glucose 65 - 99 mg/dL 72  BUN 6 - 20 mg/dL 7  Creatinine 1.30 - 8.65 mg/dL 7.84  Sodium 696 - 295 mmol/L 135  Potassium 3.5 - 5.1 mmol/L 3.8  Chloride 101 - 111 mmol/L 110  CO2 22 - 32 mmol/L 18(L)  Calcium 8.9 - 10.3 mg/dL 2.8(U)  Total Protein 6.5 - 8.1 g/dL 6.3(L)  Total Bilirubin 0.3 - 1.2 mg/dL 1.3(K)  Alkaline Phos 38 - 126 U/L 141(H)  AST 15 - 41 U/L 22  ALT 14 - 54 U/L 16    Discharge instruction:  per After Visit Summary and "Baby and Me Booklet".  After visit meds:  Allergies as of 11/20/2016   No Known Allergies     Medication List    STOP taking these medications   aspirin 81 MG chewable tablet   cephALEXin 500 MG capsule Commonly known as:  KEFLEX     TAKE these medications   acetaminophen 500 MG tablet Commonly known as:  TYLENOL Take 500 mg by mouth every 6 (six) hours as needed for mild pain or headache.   ibuprofen 600 MG tablet Commonly known as:  ADVIL,MOTRIN Take 1 tablet (600 mg total) by mouth every 6 (six) hours as needed.   PRENATE PIXIE 10-0.6-0.4-200 MG Caps Take 1 tablet by mouth daily.   senna-docusate 8.6-50 MG tablet Commonly known as:  Senokot-S Take 2 tablets by mouth at bedtime as needed for mild constipation.   Vitamin D (Ergocalciferol) 50000 units Caps capsule Commonly known as:  DRISDOL Take 1 capsule (50,000 Units total) by mouth every 7 (seven) days.       Diet: routine diet  Activity: Advance as tolerated. Pelvic rest for 6 weeks.   Outpatient follow up: 4- weeks  Postpartum contraception: IUD undecided  Newborn Data: Live born female  Birth Weight: 5 lb 7.5 oz (2480 g) APGAR: 8, 9  Newborn Delivery   Birth date/time:  11/18/2016  04:28:00 Delivery type:  Vaginal, Spontaneous Delivery      Baby Feeding: Bottle and Breast Disposition:home with mother   11/20/2016 Arlyce Harmanimothy Lockamy, DO  OB FELLOW DISCHARGE ATTESTATION  I have seen and examined this patient and agree with above documentation in the resident's note.   Frederik PearJulie P Degele, MD OB Fellow

## 2016-11-20 NOTE — Discharge Instructions (Signed)

## 2016-11-20 NOTE — Lactation Note (Signed)
This note was copied from a baby's chart. Lactation Consultation Note  Patient Name: Girl Pauline GoodHeroinesha Bartolini ZOXWR'UToday's Date: 11/20/2016 Reason for consult: Follow-up assessment;Infant < 6lbs;Term;Other (Comment);Engorgement (borderline engorged -- hand expressed , and then DEBP ) Baby is 52 hours old , per mom  breast and nipples are sore. LC assessed with moms permission and noted positional strips both  Nipples , and breast  Firm to hard nodules. LC reviewed hand expressing, and between mom and LC assisting expressed off 12 ml. LC reviewed set up of the DEBP and the #24  Flange comfortable .  LC offered to assist with latch and mom declined and said she was to sore to latch.  Sore nipple and engorgement prevention and tx reviewed. LC did show mom how to use the DEBP manually  LC went back to check on mom a 2nd time after icing and she said she was more comfortable and planned to pump.  Per mom WIC representative came in to  See her and [facility]BP .  LC had already sent a referral which the [facility] was aware.  Mother informed of post-discharge support and given phone number to the lactation department, including services for phone call assistance; out-patient appointments; and breastfeeding support group. List of other breastfeeding resources in the community given in the handout. Encouraged mother to call for problems or concerns related to breastfeeding.    Maternal Data Has patient been taught Hand Expression?: Yes (hand expressed off 12 ml )  Feeding Feeding Type: Breast Milk Nipple Type: Slow - flow  LATCH Score                   Interventions Interventions: Breast feeding basics reviewed  Lactation Tools Discussed/Used Tools: Pump Breast pump type: Double-Electric Breast Pump WIC Program: Yes Pump Review: Setup, frequency, and cleaning;Milk Storage (LC reviewed / MAI ) Initiated by:: MAI  Date initiated:: 11/20/16   Consult  Status Consult Status: Follow-up Date: 11/20/16 Follow-up type: In-patient    Matilde SprangMargaret Ann Andraya Frigon 11/20/2016, 9:27 AM

## 2016-12-19 ENCOUNTER — Ambulatory Visit: Payer: Medicaid Other | Admitting: Certified Nurse Midwife

## 2017-01-01 ENCOUNTER — Ambulatory Visit: Payer: Medicaid Other | Admitting: Certified Nurse Midwife

## 2017-01-05 ENCOUNTER — Ambulatory Visit: Payer: Medicaid Other | Admitting: Certified Nurse Midwife

## 2018-03-06 ENCOUNTER — Encounter (HOSPITAL_COMMUNITY): Payer: Self-pay | Admitting: Emergency Medicine

## 2018-03-06 ENCOUNTER — Emergency Department (HOSPITAL_COMMUNITY)
Admission: EM | Admit: 2018-03-06 | Discharge: 2018-03-06 | Disposition: A | Discharge status: Home / Self Care | Payer: Medicaid Other | Attending: Emergency Medicine | Admitting: Emergency Medicine

## 2018-03-06 ENCOUNTER — Other Ambulatory Visit: Payer: Self-pay

## 2018-03-06 DIAGNOSIS — N760 Acute vaginitis: Secondary | ICD-10-CM | POA: Insufficient documentation

## 2018-03-06 DIAGNOSIS — I10 Essential (primary) hypertension: Secondary | ICD-10-CM | POA: Insufficient documentation

## 2018-03-06 DIAGNOSIS — B9689 Other specified bacterial agents as the cause of diseases classified elsewhere: Secondary | ICD-10-CM

## 2018-03-06 DIAGNOSIS — Z87891 Personal history of nicotine dependence: Secondary | ICD-10-CM | POA: Insufficient documentation

## 2018-03-06 DIAGNOSIS — Z79899 Other long term (current) drug therapy: Secondary | ICD-10-CM | POA: Insufficient documentation

## 2018-03-06 LAB — URINALYSIS, ROUTINE W REFLEX MICROSCOPIC
Bilirubin Urine: NEGATIVE
Glucose, UA: NEGATIVE mg/dL
Hgb urine dipstick: NEGATIVE
Ketones, ur: NEGATIVE mg/dL
Nitrite: NEGATIVE
Protein, ur: NEGATIVE mg/dL
SPECIFIC GRAVITY, URINE: 1.008 (ref 1.005–1.030)
pH: 6 (ref 5.0–8.0)

## 2018-03-06 LAB — I-STAT BETA HCG BLOOD, ED (MC, WL, AP ONLY): I-stat hCG, quantitative: <5 m[IU]/mL (ref <5)

## 2018-03-06 LAB — WET PREP, GENITAL
SPERM: NONE SEEN
TRICH WET PREP: NONE SEEN
YEAST WET PREP: NONE SEEN

## 2018-03-06 MED ORDER — FLUCONAZOLE 150 MG PO TABS: ORAL_TABLET | ORAL | 0 refills | Status: DC

## 2018-03-06 MED ORDER — METRONIDAZOLE 500 MG PO TABS: 500.0000 mg | ORAL_TABLET | Freq: Two times a day (BID) | ORAL | 0 refills | Status: DC

## 2018-03-06 NOTE — ED Triage Notes (Signed)
Pt. Stated, Im having lower abdominal pain and having vaginal discomfort. This started 2 days ago.

## 2018-03-06 NOTE — Discharge Instructions (Signed)
You were seen in the ER today for vaginal irritation.  Your wet prep showed BV, we are treating this with Flagyl, an antibiotic, do not drink alcohol with taking this medicine.  We are also treating with Diflucan, a medicine for yeast infection, take 1 tablet today and repeat in 72 hours if you have not improvement of your symptoms.  Please avoid irritation to the area including intercourse or harsh fabrics.  Please take medicines as prescribed.  We have prescribed you new medication(s) today. Discuss the medications prescribed today with your pharmacist as they can have adverse effects and interactions with your other medicines including over the counter and prescribed medications. Seek medical evaluation if you start to experience new or abnormal symptoms after taking one of these medicines, seek care immediately if you start to experience difficulty breathing, feeling of your throat closing, facial swelling, or rash as these could be indications of a more serious allergic reaction  Please follow-up with women's health within 3 to 5 days.  Return to the ER for new or worsening symptoms or any other concerns.

## 2018-03-06 NOTE — ED Provider Notes (Signed)
[facility] Provider Note   CSN: [id number] Arrival date & time: 03/06/18  1711     History   Chief Complaint Chief Complaint  Patient presents with  . Vaginal Pain    HPI Lynn Garcia is a 23 y.o. female with a hx of HTN & LGSIL on pap smear who presents to the ER with complaints of vaginal discomfort (despite nursing notes) which has been occurring for the past 2 days.  Patient states that approximately 4 to 5 days ago she developed a white thick vaginal discharge that was somewhat pruritic.  She believed this to be a yeast infection as she has had these in the past and this felt similar.  She utilized an over-the-counter Vagistat vaginal suppository 2 days prior to help treat a potential yeast infection.  She states that about a day or 2 later she noticed some increased irritation to the vaginal introitus.  The pain is a burning sensation.  She states this is worse when she pees.  She states that her vaginal discharge has decreased somewhat but has remained present.  Other than urination no specific alleviating or aggravating factors.  Patient denies fever, chills, pelvic pain, abdominal pain, nausea, vomiting, diarrhea, blood in stool, or vaginal bleeding.  Patient is currently sexually active in a monogamous relationship, she does not use protection, she is not concerned for STDs.  HPI  Past Medical History:  Diagnosis Date  . Hypertension   . UTI (urinary tract infection)     Patient Active Problem List   Diagnosis Date Noted  . SVD (spontaneous vaginal delivery) 11/17/2016  . SGA (small for gestational age) 10/04/2016  . 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 Low vitamin D level 06/19/2016  . Supervision of high risk pregnancy, antepartum 06/14/2016  . Chronic hypertension 05/05/2015    Past Surgical History:  Procedure Laterality Date  . NO PAST SURGERIES       OB History    Gravida  2   Para  2   Term  2   Preterm       AB      Living  2     SAB      TAB      Ectopic      Multiple  0   Live Births  2        Obstetric Comments  Oligohydramnios         Home Medications    Prior to Admission medications   Medication Sig Start Date End Date Taking? Authorizing Provider  acetaminophen (TYLENOL) 500 MG tablet Take 500 mg by mouth every 6 (six) hours as needed for mild pain or headache.    [provider]  amLODipine (NORVASC) 5 MG tablet Take 1 tablet (5 mg total) by mouth daily. 11/20/16 11/20/17  Arlyce HarmanLockamy, Timothy, DO  ibuprofen (ADVIL,MOTRIN) 600 MG tablet Take 1 tablet (600 mg total) by mouth every 6 (six) hours as needed. 11/20/16   Lockamy, Marcial Pacasimothy, DO  Prenat-FeAsp-Meth-FA-DHA w/o A (PRENATE PIXIE) 10-0.6-0.4-200 MG CAPS Take 1 tablet by mouth daily. 06/14/16   Orvilla Cornwallenney, Rachelle A, CNM  senna-docusate (SENOKOT-S) 8.6-50 MG tablet Take 2 tablets by mouth at bedtime as needed for mild constipation. 11/20/16   Arlyce HarmanLockamy, Timothy, DO  Vitamin D, Ergocalciferol, (DRISDOL) 50000 units CAPS capsule Take 1 capsule (50,000 Units total) by mouth every 7 (seven) days. 07/12/16   Adam PhenixArnold, James G, MD    Family History Family History  Problem Relation Age of Onset  . Hypertension Mother     Social History Social History   Tobacco Use  . Smoking status: Former Smoker    Last attempt to quit: 03/2016    Years since quitting: 1.9  . Smokeless tobacco: Never Used  Substance Use Topics  . Alcohol use: No  . Drug use: No     Allergies   Patient has no known allergies.   Review of Systems Review of Systems  Constitutional: Negative for chills and fever.  Respiratory: Negative for shortness of breath.   Cardiovascular: Negative for chest pain.  Gastrointestinal: Negative for abdominal pain, blood in stool, constipation, diarrhea, nausea, rectal pain and vomiting.  Genitourinary: Positive for dysuria, vaginal discharge and vaginal pain. Negative for flank pain, genital sores,  pelvic pain, urgency and vaginal bleeding.  All other systems reviewed and are negative.    Physical Exam Updated Vital Signs BP 137/67 (BP Location: Left Arm)   Pulse 92   Temp 98.5 F (36.9 C) (Oral)   Resp 14   Ht 5' (1.524 m)   Wt 65.8 kg   LMP 02/18/2018   SpO2 99%   BMI 28.32 kg/m   Physical Exam Vitals signs and nursing note reviewed. Exam conducted with a chaperone present.  Constitutional:      General: She is not in acute distress.    Appearance: She is well-developed. She is not toxic-appearing.  HENT:     Head: Normocephalic and atraumatic.  Eyes:     General:        Right eye: No discharge.        Left eye: No discharge.     Conjunctiva/sclera: Conjunctivae normal.  Neck:     Musculoskeletal: Neck supple.  Cardiovascular:     Rate and Rhythm: Normal rate and regular rhythm.  Pulmonary:     Effort: Pulmonary effort is normal. No respiratory distress.     Breath sounds: Normal breath sounds. No wheezing, rhonchi or rales.  Abdominal:     General: There is no distension.     Palpations: Abdomen is soft.     Tenderness: There is no abdominal tenderness. There is no guarding or rebound.  Genitourinary:    Exam position: Supine.     Vagina: Vaginal discharge (white, moderate amount thin) present.     Cervix: No cervical motion tenderness or friability.     Adnexa:        Right: No mass, tenderness or fullness.         Left: No mass, tenderness or fullness.       Comments: No vesicles or lesions noted.  There is erythema to the vaginal introitus with mild skin break down bilaterally, does not appear to be infected. No purulent drainage. No abscess.  Skin:    General: Skin is warm and dry.     Findings: No rash.  Neurological:     Mental Status: She is alert.     Comments: Clear speech.   Psychiatric:        Behavior: Behavior normal.    ED Treatments / Results  Labs (all labs ordered are listed, but only abnormal results are displayed) Labs  Reviewed - No data to display  EKG None  Radiology No results found.  Procedures Procedures (including critical care time)  Medications Ordered in ED Medications - No data to display   Initial Impression / Assessment and Plan / ED Course  I have reviewed the triage vital signs and  the nursing notes.  Pertinent labs & imaging results that were available during my care of the patient were reviewed by me and considered in my medical decision making (see chart for details).   Patient presents with vaginal irritation and vaginal discharge.  She is nontoxic-appearing, no apparent distress, vitals WNL.  Patient's exam notable for no abdominal tenderness or peritoneal signs.  Her GU exam was performed with chaperone present-she does have some mild erythema with skin breakdown to the introitus, no significant wounds, does not appear to have superimposed bacterial infection/cellulitis, no abscess.  On speculum exam she does have some white vaginal discharge, not overly purulent.  She has no cervical motion tenderness or adnexal tenderness on bimanual exam. Without tenderness I do not suspect PID.  STD testing sent & pending- patient aware of pending results & need to inform sexual partner if positive. Pregnancy test negative- doubt ectopic. UA does not appear consistent with UTI- suspect dysuria is more skin irritation with urination, however will culture urine. Wet prep w/ BV- will treat with flagyl. No yeast on wet prep, however erythematous skin at vaginal introitus is concerning for this, will treat with diflucan. Discussed avoiding irritation to the area. OBGYN follow up. I discussed results, treatment plan, need for follow-up, and return precautions with the patient. Provided opportunity for questions, patient confirmed understanding and is in agreement with plan.    Final Clinical Impressions(s) / ED Diagnoses   Final diagnoses:  BV (bacterial vaginosis)  Acute vaginitis    ED Discharge  Orders         Ordered    metroNIDAZOLE (FLAGYL) 500 MG tablet  2 times daily     03/06/18 1948    fluconazole (DIFLUCAN) 150 MG tablet     03/06/18 [address]           Terrah Decoster R, PA-C 03/06/18 1950    Linwood DibblesKnapp, Jon, MD 03/07/18 [phone]

## 2018-03-07 LAB — HIV ANTIBODY (ROUTINE TESTING W REFLEX): HIV Screen 4th Generation wRfx: NONREACTIVE

## 2018-03-07 LAB — RPR: RPR Ser Ql: NONREACTIVE

## 2018-03-08 LAB — URINE CULTURE: CULTURE: NO GROWTH

## 2018-03-09 LAB — GC/CHLAMYDIA PROBE AMP (~~LOC~~) NOT AT ARMC
CHLAMYDIA, DNA PROBE: NEGATIVE
Neisseria Gonorrhea: NEGATIVE

## 2018-05-24 ENCOUNTER — Emergency Department (HOSPITAL_COMMUNITY)
Admission: EM | Admit: 2018-05-24 | Discharge: 2018-05-24 | Disposition: A | Discharge status: Home / Self Care | Payer: Self-pay | Attending: Emergency Medicine | Admitting: Emergency Medicine

## 2018-05-24 ENCOUNTER — Encounter (HOSPITAL_COMMUNITY): Payer: Self-pay | Admitting: Emergency Medicine

## 2018-05-24 ENCOUNTER — Other Ambulatory Visit: Payer: Self-pay

## 2018-05-24 DIAGNOSIS — N760 Acute vaginitis: Secondary | ICD-10-CM | POA: Insufficient documentation

## 2018-05-24 DIAGNOSIS — B9689 Other specified bacterial agents as the cause of diseases classified elsewhere: Secondary | ICD-10-CM | POA: Insufficient documentation

## 2018-05-24 DIAGNOSIS — T7840XA Allergy, unspecified, initial encounter: Secondary | ICD-10-CM | POA: Insufficient documentation

## 2018-05-24 DIAGNOSIS — Z87891 Personal history of nicotine dependence: Secondary | ICD-10-CM | POA: Insufficient documentation

## 2018-05-24 DIAGNOSIS — I10 Essential (primary) hypertension: Secondary | ICD-10-CM | POA: Insufficient documentation

## 2018-05-24 DIAGNOSIS — N739 Female pelvic inflammatory disease, unspecified: Secondary | ICD-10-CM | POA: Insufficient documentation

## 2018-05-24 DIAGNOSIS — N730 Acute parametritis and pelvic cellulitis: Secondary | ICD-10-CM

## 2018-05-24 DIAGNOSIS — R1013 Epigastric pain: Secondary | ICD-10-CM | POA: Insufficient documentation

## 2018-05-24 DIAGNOSIS — W57XXXA Bitten or stung by nonvenomous insect and other nonvenomous arthropods, initial encounter: Secondary | ICD-10-CM | POA: Insufficient documentation

## 2018-05-24 LAB — URINALYSIS, ROUTINE W REFLEX MICROSCOPIC
Bilirubin Urine: NEGATIVE
Glucose, UA: NEGATIVE mg/dL
Ketones, ur: NEGATIVE mg/dL
Leukocytes,Ua: NEGATIVE
Nitrite: NEGATIVE
Protein, ur: NEGATIVE mg/dL
Specific Gravity, Urine: 1.021 (ref 1.005–1.030)
pH: 6 (ref 5.0–8.0)

## 2018-05-24 LAB — CBC WITH DIFFERENTIAL/PLATELET
Abs Immature Granulocytes: 0.03 10*3/uL (ref 0.00–0.07)
Basophils Absolute: 0.1 10*3/uL (ref 0.0–0.1)
Basophils Relative: 1 %
Eosinophils Absolute: 0.2 10*3/uL (ref 0.0–0.5)
Eosinophils Relative: 2 %
HCT: 40.3 % (ref 36.0–46.0)
Hemoglobin: 13.2 g/dL (ref 12.0–15.0)
Immature Granulocytes: 0 %
Lymphocytes Relative: 38 %
Lymphs Abs: 3.7 10*3/uL (ref 0.7–4.0)
MCH: 30.8 pg (ref 26.0–34.0)
MCHC: 32.8 g/dL (ref 30.0–36.0)
MCV: 93.9 fL (ref 80.0–100.0)
Monocytes Absolute: 0.8 10*3/uL (ref 0.1–1.0)
Monocytes Relative: 8 %
Neutro Abs: 5.1 10*3/uL (ref 1.7–7.7)
Neutrophils Relative %: 51 %
Platelets: 246 10*3/uL (ref 150–400)
RBC: 4.29 MIL/uL (ref 3.87–5.11)
RDW: 12.9 % (ref 11.5–15.5)
WBC: 9.9 10*3/uL (ref 4.0–10.5)
nRBC: 0 % (ref 0.0–0.2)

## 2018-05-24 LAB — COMPREHENSIVE METABOLIC PANEL
ALT: 15 U/L (ref 0–44)
AST: 17 U/L (ref 15–41)
Albumin: 4.4 g/dL (ref 3.5–5.0)
Alkaline Phosphatase: 38 U/L (ref 38–126)
Anion gap: 10 (ref 5–15)
BUN: 13 mg/dL (ref 6–20)
CO2: 24 mmol/L (ref 22–32)
Calcium: 9.1 mg/dL (ref 8.9–10.3)
Chloride: 105 mmol/L (ref 98–111)
Creatinine, Ser: 0.63 mg/dL (ref 0.44–1.00)
GFR calc Af Amer: >60 mL/min (ref >60)
GFR calc non Af Amer: >60 mL/min (ref >60)
Glucose, Bld: 93 mg/dL (ref 70–99)
Potassium: 3.8 mmol/L (ref 3.5–5.1)
Sodium: 139 mmol/L (ref 135–145)
Total Bilirubin: 0.4 mg/dL (ref 0.3–1.2)
Total Protein: 7.3 g/dL (ref 6.5–8.1)

## 2018-05-24 LAB — GC/CHLAMYDIA PROBE AMP (~~LOC~~) NOT AT ARMC
Chlamydia: NEGATIVE
Neisseria Gonorrhea: NEGATIVE

## 2018-05-24 LAB — WET PREP, GENITAL
Sperm: NONE SEEN
Trich, Wet Prep: NONE SEEN
Yeast Wet Prep HPF POC: NONE SEEN

## 2018-05-24 LAB — LIPASE, BLOOD: Lipase: 35 U/L (ref 11–51)

## 2018-05-24 LAB — POC URINE PREG, ED: Preg Test, Ur: NEGATIVE

## 2018-05-24 MED ORDER — METRONIDAZOLE 500 MG PO TABS
500.0000 mg | ORAL_TABLET | Freq: Once | ORAL | Status: AC
  Administered 2018-05-24: 06:00:00 500 mg via ORAL
  Filled 2018-05-24: qty 1

## 2018-05-24 MED ORDER — LIDOCAINE HCL (PF) 1 % IJ SOLN
INTRAMUSCULAR | Status: AC
  Administered 2018-05-24: 06:00:00 2.1 mL
  Filled 2018-05-24: qty 5

## 2018-05-24 MED ORDER — DIPHENHYDRAMINE HCL 25 MG PO CAPS
25.0000 mg | ORAL_CAPSULE | Freq: Once | ORAL | Status: AC
  Administered 2018-05-24: 05:00:00 25 mg via ORAL
  Filled 2018-05-24: qty 1

## 2018-05-24 MED ORDER — DOXYCYCLINE HYCLATE 100 MG PO TABS
100.0000 mg | ORAL_TABLET | Freq: Once | ORAL | Status: AC
  Administered 2018-05-24: 06:00:00 100 mg via ORAL
  Filled 2018-05-24: qty 1

## 2018-05-24 MED ORDER — METRONIDAZOLE 500 MG PO TABS: 500.0000 mg | ORAL_TABLET | Freq: Two times a day (BID) | ORAL | 0 refills | Status: DC

## 2018-05-24 MED ORDER — CEFTRIAXONE SODIUM 250 MG IJ SOLR
250.0000 mg | Freq: Once | INTRAMUSCULAR | Status: AC
  Administered 2018-05-24: 06:00:00 250 mg via INTRAMUSCULAR
  Filled 2018-05-24: qty 250

## 2018-05-24 MED ORDER — DOXYCYCLINE HYCLATE 100 MG PO CAPS: 100.0000 mg | ORAL_CAPSULE | Freq: Two times a day (BID) | ORAL | 0 refills | Status: DC

## 2018-05-24 MED ORDER — FAMOTIDINE 20 MG PO TABS
20.0000 mg | ORAL_TABLET | Freq: Once | ORAL | Status: AC
  Administered 2018-05-24: 05:00:00 20 mg via ORAL
  Filled 2018-05-24: qty 1

## 2018-05-24 NOTE — ED Provider Notes (Signed)
Emergency Department Provider Note   I have reviewed the triage vital signs and the nursing notes.   HISTORY  Chief Complaint Abdominal Pain   HPI Lynn Garcia is a 23 y.o. female with history of hypertension who presents the emergency department today with 2 main complaints.  Patient states that she woke up this morning and had 2 bites on her left dorsal hand.  Throughout the day does have progressively become more swollen and red and itchy.  She does not take any medicines for those.  She denies any fever, nausea, vomiting or streaking up her arm.  Her second plan is that she has some epigastric abdominal pain is associated with abnormal uterine bleeding.  Patient states that she is very irregular with periods on the second and third day of the month and she had one on April 3 that was normal for her but then yesterday she started having abdominal cramping more in the upper area and the started have been vaginal bleeding afterwards.  She states that this seems like a menstrual type of bleed and cramping however is very abnormal for her to have it at this time.  She is sexually active with one female who she is not sure if he is monogamous with her.  She has not noticed any vaginal discharge does not think she could be pregnant.   Not to be related to eating.  Does not radiate.  She does drink multiple red bulls and smoke cigarettes but no persistent alcohol or drug use.  No other associated or modifying symptoms.    Past Medical History:  Diagnosis Date  . Hypertension   . UTI (urinary tract infection)     Patient Active Problem List   Diagnosis Date Noted  . SVD (spontaneous vaginal delivery) 11/17/2016  . SGA (small for gestational age) 10/04/2016  . 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 Low vitamin D level 06/19/2016  . Supervision of high risk pregnancy, antepartum 06/14/2016  . Chronic hypertension 05/05/2015    Past Surgical History:  Procedure Laterality Date   . NO PAST SURGERIES      Current Outpatient Rx  . Order #: [id number] Class: Historical Med  . Order #: [id number] Class: Normal  . Order #: [id number] Class: Print  . Order #: [id number] Class: Normal  . Order #: [id number] Class: Normal  . Order #: [id number] Class: Print  . Order #: [id number] Class: Normal  . Order #: [id number] Class: Normal  . Order #: [id number] Class: Normal    Allergies Patient has no known allergies.  Family History  Problem Relation Age of Onset  . Hypertension Mother     Social History Social History   Tobacco Use  . Smoking status: Former Smoker    Last attempt to quit: 03/2016    Years since quitting: 2.1  . Smokeless tobacco: Never Used  Substance Use Topics  . Alcohol use: No  . Drug use: No    Review of Systems  All other systems negative except as documented in the HPI. All pertinent positives and negatives as reviewed in the HPI. ____________________________________________   PHYSICAL EXAM:  VITAL SIGNS: Vitals:   05/24/18 0515 05/24/18 0530 05/24/18 0545 05/24/18 0600  BP: 113/70 (!) 107/55 (!) 107/58 137/82  Pulse: (!) 55 (!) 56 (!) 57 65  Resp: Temp:      TempSrc:      SpO2: 100% 100% 100% 100%    Constitutional: Alert and oriented. Well appearing and in no acute  distress. Eyes: Conjunctivae are normal. PERRL. EOMI. Head: Atraumatic. Nose: No congestion/rhinnorhea. Mouth/Throat: Mucous membranes are moist.  Oropharynx non-erythematous. Neck: No stridor.  No meningeal signs.   Cardiovascular: Normal rate, regular rhythm. Good peripheral circulation. Grossly normal heart sounds.   Respiratory: Normal respiratory effort.  No retractions. Lungs CTAB. Gastrointestinal: Soft and nontender. No distention.  GU: chaperoned by nurse tech, blood, no discharge, no adnexal masses, CMT present but not friable. Musculoskeletal: No lower extremity tenderness nor edema. No gross deformities of extremities. Neurologic:  Normal speech  and language. No gross focal neurologic deficits are appreciated.  Skin:  Skin is warm, two papular lesions on dorsal left hand with 2-3 cm surrounding erythema c/w insect bites. Mild warmth, no induration or streaking. No ttp in area.   ____________________________________________   LABS (all labs ordered are listed, but only abnormal results are displayed)  Labs Reviewed  WET PREP, GENITAL - Abnormal; Notable for the following components:      Result Value   Clue Cells Wet Prep HPF POC PRESENT (*)    WBC, Wet Prep HPF POC MODERATE (*)    All other components within normal limits  URINALYSIS, ROUTINE W REFLEX MICROSCOPIC - Abnormal; Notable for the following components:   Hgb urine dipstick LARGE (*)    Bacteria, UA FEW (*)    All other components within normal limits  CBC WITH DIFFERENTIAL/PLATELET  COMPREHENSIVE METABOLIC PANEL  LIPASE, BLOOD  RPR  HIV ANTIBODY (ROUTINE TESTING W REFLEX)  POC URINE PREG, ED  GC/CHLAMYDIA PROBE AMP ([place]) NOT [facility]   ____________________________________________  EKG   EKG Interpretation  Date/Time:  Friday May 24 2018 04:35:40 EDT Ventricular Rate:  56 PR Interval:    QRS Duration: 86 QT Interval:  436 QTC Calculation: 421 R Axis:   72 Text Interpretation:  Sinus rhythm Baseline wander in lead(s) V6 No old tracing to compare Confirmed by Marily MemosMesner, Dene Nazir [phone]([id number] on 05/24/2018 5:41:57 AM       ____________________________________________  RADIOLOGY  No results found.  ____________________________________________   PROCEDURES  Procedure(s) performed:   Procedures   ____________________________________________   INITIAL IMPRESSION / ASSESSMENT AND PLAN / ED COURSE  Lesions on her hand are consistent with insect bites an hypersensitivity reaction.  I doubt this is cellulitis or any type of exotic spider bite.  Will treat with Benadryl as needed.  Her abdominal pain very well could be gastritis with a red bull use  and cigarettes and abnormal eating habits however with the vaginal bleeding and the urinary symptoms we will do a pelvic exam and check a urine for pregnancy test as well.  Likely PID with CMT/BV.   Pertinent labs & imaging results that were available during my care of the patient were reviewed by me and considered in my medical decision making (see chart for details).  A medical screening exam was performed and I feel the patient has had an appropriate workup for their chief complaint at this time and likelihood of emergent condition existing is low. They have been counseled on decision, discharge, follow up and which symptoms necessitate immediate return to the emergency department. They or their family verbally stated understanding and agreement with plan and discharged in stable condition.   ____________________________________________  FINAL CLINICAL IMPRESSION(S) / ED DIAGNOSES  Final diagnoses:  Hypersensitivity reaction, initial encounter  Bacterial vaginosis  PID (acute pelvic inflammatory disease)     MEDICATIONS GIVEN DURING THIS VISIT:  Medications  famotidine (PEPCID) tablet 20 mg (20 mg  Oral Given 05/24/18 0452)  diphenhydrAMINE (BENADRYL) capsule 25 mg (25 mg Oral Given 05/24/18 0452)  cefTRIAXone (ROCEPHIN) injection 250 mg (250 mg Intramuscular Given 05/24/18 0616)  doxycycline (VIBRA-TABS) tablet 100 mg (100 mg Oral Given 05/24/18 0617)  metroNIDAZOLE (FLAGYL) tablet 500 mg (500 mg Oral Given 05/24/18 0617)  lidocaine (PF) (XYLOCAINE) 1 % injection (2.1 mLs  Given 05/24/18 0619)     NEW OUTPATIENT MEDICATIONS STARTED DURING THIS VISIT:  Discharge Medication List as of 05/24/2018  6:10 AM    START taking these medications   Details  doxycycline (VIBRAMYCIN) 100 MG capsule Take 1 capsule (100 mg total) by mouth 2 (two) times daily. One po bid x 14 days, Starting Fri 05/24/2018, Print        Note:  This note was prepared with assistance of Dragon voice recognition  software. Occasional wrong-word or sound-a-like substitutions may have occurred due to the inherent limitations of voice recognition software.   Calton Harshfield, Barbara Cower, MD 05/24/18 [phone]

## 2018-05-24 NOTE — ED Triage Notes (Signed)
Patient reports intermittent upper abdominal pain onset Wednesday this week , denies emesis or diarrhea , no fever or chills , pt. added insect bite at left hand with redness/itching yesterday .

## 2018-05-27 LAB — RPR: RPR Ser Ql: NONREACTIVE

## 2018-05-27 LAB — HIV ANTIBODY (ROUTINE TESTING W REFLEX): HIV Screen 4th Generation wRfx: NONREACTIVE

## 2018-07-19 ENCOUNTER — Other Ambulatory Visit: Payer: Self-pay

## 2018-07-19 ENCOUNTER — Inpatient Hospital Stay (HOSPITAL_COMMUNITY)
Admission: AD | Admit: 2018-07-19 | Discharge: 2018-07-19 | Disposition: A | Discharge status: Home / Self Care | Payer: Self-pay | Attending: Family Medicine | Admitting: Family Medicine

## 2018-07-19 ENCOUNTER — Encounter (HOSPITAL_COMMUNITY): Payer: Self-pay | Admitting: *Deleted

## 2018-07-19 ENCOUNTER — Inpatient Hospital Stay (HOSPITAL_COMMUNITY): Payer: Self-pay

## 2018-07-19 DIAGNOSIS — Z3A08 8 weeks gestation of pregnancy: Secondary | ICD-10-CM | POA: Insufficient documentation

## 2018-07-19 DIAGNOSIS — O26899 Other specified pregnancy related conditions, unspecified trimester: Secondary | ICD-10-CM

## 2018-07-19 DIAGNOSIS — O10011 Pre-existing essential hypertension complicating pregnancy, first trimester: Secondary | ICD-10-CM | POA: Insufficient documentation

## 2018-07-19 DIAGNOSIS — Z79899 Other long term (current) drug therapy: Secondary | ICD-10-CM | POA: Insufficient documentation

## 2018-07-19 DIAGNOSIS — Z7901 Long term (current) use of anticoagulants: Secondary | ICD-10-CM | POA: Insufficient documentation

## 2018-07-19 DIAGNOSIS — O26891 Other specified pregnancy related conditions, first trimester: Secondary | ICD-10-CM | POA: Insufficient documentation

## 2018-07-19 DIAGNOSIS — R109 Unspecified abdominal pain: Secondary | ICD-10-CM | POA: Insufficient documentation

## 2018-07-19 DIAGNOSIS — O3680X0 Pregnancy with inconclusive fetal viability, not applicable or unspecified: Secondary | ICD-10-CM | POA: Insufficient documentation

## 2018-07-19 LAB — URINALYSIS, ROUTINE W REFLEX MICROSCOPIC
Bilirubin Urine: NEGATIVE
Glucose, UA: NEGATIVE mg/dL
Hgb urine dipstick: NEGATIVE
Ketones, ur: NEGATIVE mg/dL
Leukocytes,Ua: NEGATIVE
Nitrite: NEGATIVE
Protein, ur: NEGATIVE mg/dL
Specific Gravity, Urine: 1.018 (ref 1.005–1.030)
pH: 6 (ref 5.0–8.0)

## 2018-07-19 LAB — GC/CHLAMYDIA PROBE AMP (~~LOC~~) NOT AT ARMC
Chlamydia: NEGATIVE
Neisseria Gonorrhea: NEGATIVE

## 2018-07-19 LAB — CBC
HCT: 35.4 % — ABNORMAL LOW (ref 36.0–46.0)
Hemoglobin: 12.5 g/dL (ref 12.0–15.0)
MCH: 31.3 pg (ref 26.0–34.0)
MCHC: 35.3 g/dL (ref 30.0–36.0)
MCV: 88.7 fL (ref 80.0–100.0)
Platelets: 208 10*3/uL (ref 150–400)
RBC: 3.99 MIL/uL (ref 3.87–5.11)
RDW: 11.9 % (ref 11.5–15.5)
WBC: 10.7 10*3/uL — ABNORMAL HIGH (ref 4.0–10.5)
nRBC: 0 % (ref 0.0–0.2)

## 2018-07-19 LAB — HIV ANTIBODY (ROUTINE TESTING W REFLEX): HIV Screen 4th Generation wRfx: NONREACTIVE

## 2018-07-19 LAB — WET PREP, GENITAL
Sperm: NONE SEEN
Trich, Wet Prep: NONE SEEN
Yeast Wet Prep HPF POC: NONE SEEN

## 2018-07-19 LAB — POCT PREGNANCY, URINE: Preg Test, Ur: POSITIVE — AB

## 2018-07-19 LAB — HCG, QUANTITATIVE, PREGNANCY: hCG, Beta Chain, Quant, S: 137259 m[IU]/mL — ABNORMAL HIGH (ref <5)

## 2018-07-19 NOTE — Progress Notes (Signed)
Marie Williams CNM in earlier to discuss test results. Written and verbal d/c instructions given and understanding voiced 

## 2018-07-19 NOTE — MAU Note (Signed)
Having sharp pains in mid to upper abd since THurs am. HArd to stand straight up because my whole belly is tight. Having normal BMs. Denies vag bleeding or d/c

## 2018-07-19 NOTE — MAU Provider Note (Signed)
Chief Complaint: Abdominal Pain   First Provider Initiated Contact with Patient 07/19/18 0108        SUBJECTIVE HPI: Lynn Garcia is a 23 y.o. G2P2002 at Unknown by LMP who presents to maternity admissions reporting abdominal pain from mid to upper abdomen.  Denies bleeding. . She denies vaginal bleeding, vaginal itching/burning, urinary symptoms, h/a, dizziness, n/v, or fever/chills.    RN Note: Having sharp pains in mid to upper abd since THurs am. HArd to stand straight up because my whole belly is tight. Having normal BMs. Denies vag bleeding or d/c   Past Medical History:  Diagnosis Date  . Hypertension   . UTI (urinary tract infection)    Past Surgical History:  Procedure Laterality Date  . NO PAST SURGERIES     Social History   Socioeconomic History  . Marital status: Single    Spouse name: Not on file  . Number of children: Not on file  . Years of education: Not on file  . Highest education level: Not on file  Occupational History  . Not on file  Social Needs  . Financial resource strain: Not on file  . Food insecurity    Worry: Not on file    Inability: Not on file  . Transportation needs    Medical: Not on file    Non-medical: Not on file  Tobacco Use  . Smoking status: Former Smoker    Quit date: 03/2016    Years since quitting: 2.3  . Smokeless tobacco: Never Used  Substance and Sexual Activity  . Alcohol use: No  . Drug use: No  . Sexual activity: Yes    Birth control/protection: None  Lifestyle  . Physical activity    Days per week: Not on file    Minutes per session: Not on file  . Stress: Not on file  Relationships  . Social Musicianconnections    Talks on phone: Not on file    Gets together: Not on file    Attends religious service: Not on file    Active member of club or organization: Not on file    Attends meetings of clubs or organizations: Not on file    Relationship status: Not on file  . Intimate partner violence    Fear of current or  ex partner: Not on file    Emotionally abused: Not on file    Physically abused: Not on file    Forced sexual activity: Not on file  Other Topics Concern  . Not on file  Social History Narrative  . Not on file   No current facility-administered medications on file prior to encounter.    Current Outpatient Medications on File Prior to Encounter  Medication Sig Dispense Refill  . acetaminophen (TYLENOL) 500 MG tablet Take 500 mg by mouth every 6 (six) hours as needed for mild pain or headache.    Marland Kitchen. amLODipine (NORVASC) 5 MG tablet Take 1 tablet (5 mg total) by mouth daily. 30 tablet 1  . doxycycline (VIBRAMYCIN) 100 MG capsule Take 1 capsule (100 mg total) by mouth 2 (two) times daily. One po bid x 14 days 28 capsule 0  . fluconazole (DIFLUCAN) 150 MG tablet Take 1 table today, if symptoms have not improved repeat in 72 hours. 2 tablet 0  . ibuprofen (ADVIL,MOTRIN) 600 MG tablet Take 1 tablet (600 mg total) by mouth every 6 (six) hours as needed. 40 tablet 0  . metroNIDAZOLE (FLAGYL) 500 MG tablet Take 1 tablet (500  mg total) by mouth 2 (two) times daily. One po bid x 14 days 28 tablet 0  . Prenat-FeAsp-Meth-FA-DHA w/o A (PRENATE PIXIE) 10-0.6-0.4-200 MG CAPS Take 1 tablet by mouth daily. 30 capsule 12  . senna-docusate (SENOKOT-S) 8.6-50 MG tablet Take 2 tablets by mouth at bedtime as needed for mild constipation. 6 tablet 0  . Vitamin D, Ergocalciferol, (DRISDOL) 50000 units CAPS capsule Take 1 capsule (50,000 Units total) by mouth every 7 (seven) days. 30 capsule 2   No Known Allergies  I have reviewed patient's Past Medical Hx, Surgical Hx, Family Hx, Social Hx, medications and allergies.   ROS:  Review of Systems  Constitutional: Negative for chills and fever.  Respiratory: Negative for shortness of breath.   Gastrointestinal: Positive for abdominal pain and constipation. Negative for diarrhea and nausea.  Genitourinary: Negative for dysuria and vaginal bleeding.   Review of  Systems  Other systems negative   Physical Exam  Physical Exam Patient Vitals for the past 24 hrs:  BP Temp Pulse Resp Height Weight  07/19/18 0043 130/68 - 69 - - -  07/19/18 0041 - 98.3 F (36.8 C) - 18 5\' 1"  (1.549 m) 50.8 kg   Constitutional: Well-developed, well-nourished female in no acute distress.  Cardiovascular: normal rate Respiratory: normal effort GI: Abd soft, non-tender. Pos BS x 4 MS: Extremities nontender, no edema, normal ROM Neurologic: Alert and oriented x 4.  GU: Neg CVAT.  PELVIC EXAM: Cervix pink, visually closed, without lesion, scant white creamy discharge, vaginal walls and external genitalia normal Bimanual exam: Cervix 0/long/high, firm, anterior, neg CMT, uterus nontender, nonenlarged, adnexa without tenderness, enlargement, or mass   LAB RESULTS Results for orders placed or performed during the hospital encounter of 07/19/18 (from the past 24 hour(s))  Urinalysis, Routine w reflex microscopic     Status: None   Collection Time: 07/19/18 12:47 AM  Result Value Ref Range   Color, Urine YELLOW YELLOW   APPearance CLEAR CLEAR   Specific Gravity, Urine 1.018 1.005 - 1.030   pH 6.0 5.0 - 8.0   Glucose, UA NEGATIVE NEGATIVE mg/dL   Hgb urine dipstick NEGATIVE NEGATIVE   Bilirubin Urine NEGATIVE NEGATIVE   Ketones, ur NEGATIVE NEGATIVE mg/dL   Protein, ur NEGATIVE NEGATIVE mg/dL   Nitrite NEGATIVE NEGATIVE   Leukocytes,Ua NEGATIVE NEGATIVE  Pregnancy, urine POC     Status: Abnormal   Collection Time: 07/19/18 12:58 AM  Result Value Ref Range   Preg Test, Ur POSITIVE (A) NEGATIVE  Wet prep, genital     Status: Abnormal   Collection Time: 07/19/18  1:13 AM   Specimen: Cervical/Vaginal swab  Result Value Ref Range   Yeast Wet Prep HPF POC NONE SEEN NONE SEEN   Trich, Wet Prep NONE SEEN NONE SEEN   Clue Cells Wet Prep HPF POC PRESENT (A) NONE SEEN   WBC, Wet Prep HPF POC MODERATE (A) NONE SEEN   Sperm NONE SEEN   hCG, quantitative, pregnancy      Status: Abnormal   Collection Time: 07/19/18  1:17 AM  Result Value Ref Range   hCG, Beta Chain, Quant, S 137,259 (H) <5 mIU/mL  CBC     Status: Abnormal   Collection Time: 07/19/18  1:17 AM  Result Value Ref Range   WBC 10.7 (H) 4.0 - 10.5 K/uL   RBC 3.99 3.87 - 5.11 MIL/uL   Hemoglobin 12.5 12.0 - 15.0 g/dL   HCT 16.135.4 (L) 09.636.0 - 04.546.0 %   MCV 88.7 80.0 -  100.0 fL   MCH 31.3 26.0 - 34.0 pg   MCHC 35.3 30.0 - 36.0 g/dL   RDW 11.9 11.5 - 15.5 %   Platelets 208 150 - 400 K/uL   nRBC 0.0 0.0 - 0.2 %        IMAGING US Ob Comp Less 14 Wks  Result Date: 07/19/2018 CLINICAL DATA:  Pregnant patient in first-trimester pregnancy with abdominal pain, pregnancy of unknown anatomic locations. EXAM: OBSTETRIC <14 WK ULTRASOUND TECHNIQUE: Transabdominal ultrasound was performed for evaluation of the gestation as well as the maternal uterus and adnexal regions. COMPARISON:  None. FINDINGS: Intrauterine gestational sac: Single Yolk sac:  Visualized. Embryo:  Visualized. Cardiac Activity: Visualized. Heart Rate: 157 bpm CRL:   17.8 mm   8 w 1 d                  [place] EDC: 02/27/2019 Subchorionic hemorrhage:  None visualized. Maternal uterus/adnexae: Both ovaries are visualized and are normal. Corpus luteal cyst in the right ovary. No adnexal mass or pelvic free fluid. IMPRESSION: Single live intrauterine pregnancy estimated gestational age [redacted] weeks 1 day based on crown-rump length for ultrasound [facility] 02/27/2019. No subchorionic hemorrhage. Electronically Signed   By: Keith Rake M.D.   On: 07/19/2018 02:05     MAU Management/MDM: Ordered usual first trimester r/o ectopic labs.   Pelvic exam and cultures done Will check baseline Ultrasound to rule out ectopic.  This bleeding/pain can represent a normal pregnancy with bleeding, spontaneous abortion or even an ectopic which can be life-threatening.  The process as listed above helps to determine which of these is present.   Reviewed findings with  patient Recently treated with Flagyl and not complaining of odor,so will not treat clue cells Plans [facility] at [facility] Discussed constipation treatment and prevention  ASSESSMENT Abdominal pain in early pregnancy Pregnancy of unknown location Single live pregnancy at [redacted]w[redacted]d   PLAN Discharge home Start prenatal care Miralax as needed for constipation  Pt stable at time of discharge. Encouraged to return here or to other Urgent Care/ED if she develops worsening of symptoms, increase in pain, fever, or other concerning symptoms.    Hansel Feinstein CNM, MSN Certified Nurse-Midwife 07/19/2018  1:08 AM

## 2018-07-19 NOTE — Discharge Instructions (Signed)
First Trimester of Pregnancy  The first trimester of pregnancy is from week 1 until the end of week 13 (months 1 through 3). A week after a sperm fertilizes an egg, the egg will implant on the wall of the uterus. This embryo will begin to develop into a baby. Genes from you and your partner will form the baby. The female genes will determine whether the baby will be a boy or a girl. At 6-8 weeks, the eyes and face will be formed, and the heartbeat can be seen on ultrasound. At the end of 12 weeks, all the baby's organs will be formed.  Now that you are pregnant, you will want to do everything you can to have a healthy baby. Two of the most important things are to get good prenatal care and to follow your health care provider's instructions. Prenatal care is all the medical care you receive before the baby's birth. This care will help prevent, find, and treat any problems during the pregnancy and childbirth.  Body changes during your first trimester  Your body goes through many changes during pregnancy. The changes vary from woman to woman.   You may gain or lose a couple of pounds at first.   You may feel sick to your stomach (nauseous) and you may throw up (vomit). If the vomiting is uncontrollable, call your health care provider.   You may tire easily.   You may develop headaches that can be relieved by medicines. All medicines should be approved by your health care provider.   You may urinate more often. Painful urination may mean you have a bladder infection.   You may develop heartburn as a result of your pregnancy.   You may develop constipation because certain hormones are causing the muscles that push stool through your intestines to slow down.   You may develop hemorrhoids or swollen veins (varicose veins).   Your breasts may begin to grow larger and become tender. Your nipples may stick out more, and the tissue that surrounds them (areola) may become darker.   Your gums may bleed and may be  sensitive to brushing and flossing.   Dark spots or blotches (chloasma, mask of pregnancy) may develop on your face. This will likely fade after the baby is born.   Your menstrual periods will stop.   You may have a loss of appetite.   You may develop cravings for certain kinds of food.   You may have changes in your emotions from day to day, such as being excited to be pregnant or being concerned that something may go wrong with the pregnancy and baby.   You may have more vivid and strange dreams.   You may have changes in your hair. These can include thickening of your hair, rapid growth, and changes in texture. Some women also have hair loss during or after pregnancy, or hair that feels dry or thin. Your hair will most likely return to normal after your baby is born.  What to expect at prenatal visits  During a routine prenatal visit:   You will be weighed to make sure you and the baby are growing normally.   Your blood pressure will be taken.   Your abdomen will be measured to track your baby's growth.   The fetal heartbeat will be listened to between weeks 10 and 14 of your pregnancy.   Test results from any previous visits will be discussed.  Your health care provider may ask you:     How you are feeling.   If you are feeling the baby move.   If you have had any abnormal symptoms, such as leaking fluid, bleeding, severe headaches, or abdominal cramping.   If you are using any tobacco products, including cigarettes, chewing tobacco, and electronic cigarettes.   If you have any questions.  Other tests that may be performed during your first trimester include:   Blood tests to find your blood type and to check for the presence of any previous infections. The tests will also be used to check for low iron levels (anemia) and protein on red blood cells (Rh antibodies). Depending on your risk factors, or if you previously had diabetes during pregnancy, you may have tests to check for high blood sugar  that affects pregnant women (gestational diabetes).   Urine tests to check for infections, diabetes, or protein in the urine.   An ultrasound to confirm the proper growth and development of the baby.   Fetal screens for spinal cord problems (spina bifida) and Down syndrome.   HIV (human immunodeficiency virus) testing. Routine prenatal testing includes screening for HIV, unless you choose not to have this test.   You may need other tests to make sure you and the baby are doing well.  Follow these instructions at home:  Medicines   Follow your health care provider's instructions regarding medicine use. Specific medicines may be either safe or unsafe to take during pregnancy.   Take a prenatal vitamin that contains at least 600 micrograms (mcg) of folic acid.   If you develop constipation, try taking a stool softener if your health care provider approves.  Eating and drinking     Eat a balanced diet that includes fresh fruits and vegetables, whole grains, good sources of protein such as meat, eggs, or tofu, and low-fat dairy. Your health care provider will help you determine the amount of weight gain that is right for you.   Avoid raw meat and uncooked cheese. These carry germs that can cause birth defects in the baby.   Eating four or five small meals rather than three large meals a day may help relieve nausea and vomiting. If you start to feel nauseous, eating a few soda crackers can be helpful. Drinking liquids between meals, instead of during meals, also seems to help ease nausea and vomiting.   Limit foods that are high in fat and processed sugars, such as fried and sweet foods.   To prevent constipation:  ? Eat foods that are high in fiber, such as fresh fruits and vegetables, whole grains, and beans.  ? Drink enough fluid to keep your urine clear or pale yellow.  Activity   Exercise only as directed by your health care provider. Most women can continue their usual exercise routine during  pregnancy. Try to exercise for 30 minutes at least 5 days a week. Exercising will help you:  ? Control your weight.  ? Stay in shape.  ? Be prepared for labor and delivery.   Experiencing pain or cramping in the lower abdomen or lower back is a good sign that you should stop exercising. Check with your health care provider before continuing with normal exercises.   Try to avoid standing for long periods of time. Move your legs often if you must stand in one place for a long time.   Avoid heavy lifting.   Wear low-heeled shoes and practice good posture.   You may continue to have sex unless your health care   provider tells you not to.  Relieving pain and discomfort   Wear a good support bra to relieve breast tenderness.   Take warm sitz baths to soothe any pain or discomfort caused by hemorrhoids. Use hemorrhoid cream if your health care provider approves.   Rest with your legs elevated if you have leg cramps or low back pain.   If you develop varicose veins in your legs, wear support hose. Elevate your feet for 15 minutes, 3-4 times a day. Limit salt in your diet.  Prenatal care   Schedule your prenatal visits by the twelfth week of pregnancy. They are usually scheduled monthly at first, then more often in the last 2 months before delivery.   Write down your questions. Take them to your prenatal visits.   Keep all your prenatal visits as told by your health care provider. This is important.  Safety   Wear your seat belt at all times when driving.   Make a list of emergency phone numbers, including numbers for family, friends, the hospital, and police and fire departments.  General instructions   Ask your health care provider for a referral to a local prenatal education class. Begin classes no later than the beginning of month 6 of your pregnancy.   Ask for help if you have counseling or nutritional needs during pregnancy. Your health care provider can offer advice or refer you to specialists for help  with various needs.   Do not use hot tubs, steam rooms, or saunas.   Do not douche or use tampons or scented sanitary pads.   Do not cross your legs for long periods of time.   Avoid cat litter boxes and soil used by cats. These carry germs that can cause birth defects in the baby and possibly loss of the fetus by miscarriage or stillbirth.   Avoid all smoking, herbs, alcohol, and medicines not prescribed by your health care provider. Chemicals in these products affect the formation and growth of the baby.   Do not use any products that contain nicotine or tobacco, such as cigarettes and e-cigarettes. If you need help quitting, ask your health care provider. You may receive counseling support and other resources to help you quit.   Schedule a dentist appointment. At home, brush your teeth with a soft toothbrush and be gentle when you floss.  Contact a health care provider if:   You have dizziness.   You have mild pelvic cramps, pelvic pressure, or nagging pain in the abdominal area.   You have persistent nausea, vomiting, or diarrhea.   You have a bad smelling vaginal discharge.   You have pain when you urinate.   You notice increased swelling in your face, hands, legs, or ankles.   You are exposed to fifth disease or chickenpox.   You are exposed to German measles (rubella) and have never had it.  Get help right away if:   You have a fever.   You are leaking fluid from your vagina.   You have spotting or bleeding from your vagina.   You have severe abdominal cramping or pain.   You have rapid weight gain or loss.   You vomit blood or material that looks like coffee grounds.   You develop a severe headache.   You have shortness of breath.   You have any kind of trauma, such as from a fall or a car accident.  Summary   The first trimester of pregnancy is from week 1 until   the end of week 13 (months 1 through 3).   Your body goes through many changes during pregnancy. The changes vary from  woman to woman.   You will have routine prenatal visits. During those visits, your health care provider will examine you, discuss any test results you may have, and talk with you about how you are feeling.  This information is not intended to replace advice given to you by your health care provider. Make sure you discuss any questions you have with your health care provider.  Document Released: 01/10/2001 Document Revised: 12/29/2015 Document Reviewed: 12/29/2015  [employer] Interactive Patient Education  2019 [employer]

## 2018-08-20 ENCOUNTER — Ambulatory Visit (INDEPENDENT_AMBULATORY_CARE_PROVIDER_SITE_OTHER): Payer: Medicaid Other | Admitting: Advanced Practice Midwife

## 2018-08-20 ENCOUNTER — Other Ambulatory Visit: Payer: Self-pay

## 2018-08-20 ENCOUNTER — Encounter: Payer: Self-pay | Admitting: Advanced Practice Midwife

## 2018-08-20 VITALS — BP 132/74 | HR 75 | Ht 60.0 in | Wt 134.0 lb

## 2018-08-20 DIAGNOSIS — Z124 Encounter for screening for malignant neoplasm of cervix: Secondary | ICD-10-CM

## 2018-08-20 DIAGNOSIS — R519 Headache, unspecified: Secondary | ICD-10-CM

## 2018-08-20 DIAGNOSIS — Z3A19 19 weeks gestation of pregnancy: Secondary | ICD-10-CM | POA: Diagnosis not present

## 2018-08-20 DIAGNOSIS — Z3481 Encounter for supervision of other normal pregnancy, first trimester: Secondary | ICD-10-CM | POA: Diagnosis not present

## 2018-08-20 DIAGNOSIS — O0990 Supervision of high risk pregnancy, unspecified, unspecified trimester: Secondary | ICD-10-CM | POA: Insufficient documentation

## 2018-08-20 DIAGNOSIS — I10 Essential (primary) hypertension: Secondary | ICD-10-CM

## 2018-08-20 DIAGNOSIS — O10911 Unspecified pre-existing hypertension complicating pregnancy, first trimester: Secondary | ICD-10-CM

## 2018-08-20 DIAGNOSIS — O0991 Supervision of high risk pregnancy, unspecified, first trimester: Secondary | ICD-10-CM

## 2018-08-20 DIAGNOSIS — O0992 Supervision of high risk pregnancy, unspecified, second trimester: Secondary | ICD-10-CM | POA: Diagnosis not present

## 2018-08-20 DIAGNOSIS — Z3480 Encounter for supervision of other normal pregnancy, unspecified trimester: Secondary | ICD-10-CM

## 2018-08-20 DIAGNOSIS — Z8759 Personal history of other complications of pregnancy, childbirth and the puerperium: Secondary | ICD-10-CM | POA: Insufficient documentation

## 2018-08-20 DIAGNOSIS — O219 Vomiting of pregnancy, unspecified: Secondary | ICD-10-CM | POA: Diagnosis not present

## 2018-08-20 DIAGNOSIS — O26891 Other specified pregnancy related conditions, first trimester: Secondary | ICD-10-CM | POA: Insufficient documentation

## 2018-08-20 DIAGNOSIS — O10912 Unspecified pre-existing hypertension complicating pregnancy, second trimester: Secondary | ICD-10-CM

## 2018-08-20 DIAGNOSIS — O09292 Supervision of pregnancy with other poor reproductive or obstetric history, second trimester: Secondary | ICD-10-CM

## 2018-08-20 DIAGNOSIS — R87612 Low grade squamous intraepithelial lesion on cytologic smear of cervix (LGSIL): Secondary | ICD-10-CM

## 2018-08-20 MED ORDER — ASPIRIN EC 81 MG PO TBEC: 81.0000 mg | DELAYED_RELEASE_TABLET | Freq: Every day | ORAL | 6 refills | Status: AC

## 2018-08-20 MED ORDER — BLOOD PRESSURE MONITOR KIT: 1.0000 | PACK | 0 refills | Status: DC

## 2018-08-20 MED ORDER — METOCLOPRAMIDE HCL 10 MG PO TABS: 10.0000 mg | ORAL_TABLET | Freq: Four times a day (QID) | ORAL | 5 refills | Status: DC | PRN

## 2018-08-20 MED ORDER — BUTALBITAL-APAP-CAFFEINE 50-325-40 MG PO TABS: 1.0000 | ORAL_TABLET | Freq: Four times a day (QID) | ORAL | 0 refills | Status: DC | PRN

## 2018-08-20 MED ORDER — PRENATE PIXIE 10-0.6-0.4-200 MG PO CAPS: 1.0000 | ORAL_CAPSULE | Freq: Every day | ORAL | 12 refills | Status: DC

## 2018-08-20 MED ORDER — BLOOD PRESSURE MONITOR KIT: 1.0000 | PACK | 0 refills | Status: AC

## 2018-08-20 NOTE — Patient Instructions (Signed)
First Trimester of Pregnancy The first trimester of pregnancy is from week 1 until the end of week 13 (months 1 through 3). A week after a sperm fertilizes an egg, the egg will implant on the wall of the uterus. This embryo will begin to develop into a baby. Genes from you and your partner will form the baby. The female genes will determine whether the baby will be a boy or a girl. At 6-8 weeks, the eyes and face will be formed, and the heartbeat can be seen on ultrasound. At the end of 12 weeks, all the baby's organs will be formed. Now that you are pregnant, you will want to do everything you can to have a healthy baby. Two of the most important things are to get good prenatal care and to follow your health care provider's instructions. Prenatal care is all the medical care you receive before the baby's birth. This care will help prevent, find, and treat any problems during the pregnancy and childbirth. Body changes during your first trimester Your body goes through many changes during pregnancy. The changes vary from woman to woman.  You may gain or lose a couple of pounds at first.  You may feel sick to your stomach (nauseous) and you may throw up (vomit). If the vomiting is uncontrollable, call your health care provider.  You may tire easily.  You may develop headaches that can be relieved by medicines. All medicines should be approved by your health care provider.  You may urinate more often. Painful urination may mean you have a bladder infection.  You may develop heartburn as a result of your pregnancy.  You may develop constipation because certain hormones are causing the muscles that push stool through your intestines to slow down.  You may develop hemorrhoids or swollen veins (varicose veins).  Your breasts may begin to grow larger and become tender. Your nipples may stick out more, and the tissue that surrounds them (areola) may become darker.  Your gums may bleed and may be  sensitive to brushing and flossing.  Dark spots or blotches (chloasma, mask of pregnancy) may develop on your face. This will likely fade after the baby is born.  Your menstrual periods will stop.  You may have a loss of appetite.  You may develop cravings for certain kinds of food.  You may have changes in your emotions from day to day, such as being excited to be pregnant or being concerned that something may go wrong with the pregnancy and baby.  You may have more vivid and strange dreams.  You may have changes in your hair. These can include thickening of your hair, rapid growth, and changes in texture. Some women also have hair loss during or after pregnancy, or hair that feels dry or thin. Your hair will most likely return to normal after your baby is born. What to expect at prenatal visits During a routine prenatal visit:  You will be weighed to make sure you and the baby are growing normally.  Your blood pressure will be taken.  Your abdomen will be measured to track your baby's growth.  The fetal heartbeat will be listened to between weeks 10 and 14 of your pregnancy.  Test results from any previous visits will be discussed. Your health care provider may ask you:  How you are feeling.  If you are feeling the baby move.  If you have had any abnormal symptoms, such as leaking fluid, bleeding, severe headaches, or abdominal   cramping.  If you are using any tobacco products, including cigarettes, chewing tobacco, and electronic cigarettes.  If you have any questions. Other tests that may be performed during your first trimester include:  Blood tests to find your blood type and to check for the presence of any previous infections. The tests will also be used to check for low iron levels (anemia) and protein on red blood cells (Rh antibodies). Depending on your risk factors, or if you previously had diabetes during pregnancy, you may have tests to check for high blood sugar  that affects pregnant women (gestational diabetes).  Urine tests to check for infections, diabetes, or protein in the urine.  An ultrasound to confirm the proper growth and development of the baby.  Fetal screens for spinal cord problems (spina bifida) and Down syndrome.  HIV (human immunodeficiency virus) testing. Routine prenatal testing includes screening for HIV, unless you choose not to have this test.  You may need other tests to make sure you and the baby are doing well. Follow these instructions at home: Medicines  Follow your health care provider's instructions regarding medicine use. Specific medicines may be either safe or unsafe to take during pregnancy.  Take a prenatal vitamin that contains at least 600 micrograms (mcg) of folic acid.  If you develop constipation, try taking a stool softener if your health care provider approves. Eating and drinking   Eat a balanced diet that includes fresh fruits and vegetables, whole grains, good sources of protein such as meat, eggs, or tofu, and low-fat dairy. Your health care provider will help you determine the amount of weight gain that is right for you.  Avoid raw meat and uncooked cheese. These carry germs that can cause birth defects in the baby.  Eating four or five small meals rather than three large meals a day may help relieve nausea and vomiting. If you start to feel nauseous, eating a few soda crackers can be helpful. Drinking liquids between meals, instead of during meals, also seems to help ease nausea and vomiting.  Limit foods that are high in fat and processed sugars, such as fried and sweet foods.  To prevent constipation: ? Eat foods that are high in fiber, such as fresh fruits and vegetables, whole grains, and beans. ? Drink enough fluid to keep your urine clear or pale yellow. Activity  Exercise only as directed by your health care provider. Most women can continue their usual exercise routine during  pregnancy. Try to exercise for 30 minutes at least 5 days a week. Exercising will help you: ? Control your weight. ? Stay in shape. ? Be prepared for labor and delivery.  Experiencing pain or cramping in the lower abdomen or lower back is a good sign that you should stop exercising. Check with your health care provider before continuing with normal exercises.  Try to avoid standing for long periods of time. Move your legs often if you must stand in one place for a long time.  Avoid heavy lifting.  Wear low-heeled shoes and practice good posture.  You may continue to have sex unless your health care provider tells you not to. Relieving pain and discomfort  Wear a good support bra to relieve breast tenderness.  Take warm sitz baths to soothe any pain or discomfort caused by hemorrhoids. Use hemorrhoid cream if your health care provider approves.  Rest with your legs elevated if you have leg cramps or low back pain.  If you develop varicose veins in   your legs, wear support hose. Elevate your feet for 15 minutes, 3-4 times a day. Limit salt in your diet. Prenatal care  Schedule your prenatal visits by the twelfth week of pregnancy. They are usually scheduled monthly at first, then more often in the last 2 months before delivery.  Write down your questions. Take them to your prenatal visits.  Keep all your prenatal visits as told by your health care provider. This is important. Safety  Wear your seat belt at all times when driving.  Make a list of emergency phone numbers, including numbers for family, friends, the hospital, and police and fire departments. General instructions  Ask your health care provider for a referral to a local prenatal education class. Begin classes no later than the beginning of month 6 of your pregnancy.  Ask for help if you have counseling or nutritional needs during pregnancy. Your health care provider can offer advice or refer you to specialists for help  with various needs.  Do not use hot tubs, steam rooms, or saunas.  Do not douche or use tampons or scented sanitary pads.  Do not cross your legs for long periods of time.  Avoid cat litter boxes and soil used by cats. These carry germs that can cause birth defects in the baby and possibly loss of the fetus by miscarriage or stillbirth.  Avoid all smoking, herbs, alcohol, and medicines not prescribed by your health care provider. Chemicals in these products affect the formation and growth of the baby.  Do not use any products that contain nicotine or tobacco, such as cigarettes and e-cigarettes. If you need help quitting, ask your health care provider. You may receive counseling support and other resources to help you quit.  Schedule a dentist appointment. At home, brush your teeth with a soft toothbrush and be gentle when you floss. Contact a health care provider if:  You have dizziness.  You have mild pelvic cramps, pelvic pressure, or nagging pain in the abdominal area.  You have persistent nausea, vomiting, or diarrhea.  You have a bad smelling vaginal discharge.  You have pain when you urinate.  You notice increased swelling in your face, hands, legs, or ankles.  You are exposed to fifth disease or chickenpox.  You are exposed to German measles (rubella) and have never had it. Get help right away if:  You have a fever.  You are leaking fluid from your vagina.  You have spotting or bleeding from your vagina.  You have severe abdominal cramping or pain.  You have rapid weight gain or loss.  You vomit blood or material that looks like coffee grounds.  You develop a severe headache.  You have shortness of breath.  You have any kind of trauma, such as from a fall or a car accident. Summary  The first trimester of pregnancy is from week 1 until the end of week 13 (months 1 through 3).  Your body goes through many changes during pregnancy. The changes vary from  woman to woman.  You will have routine prenatal visits. During those visits, your health care provider will examine you, discuss any test results you may have, and talk with you about how you are feeling. This information is not intended to replace advice given to you by your health care provider. Make sure you discuss any questions you have with your health care provider. Document Released: 01/10/2001 Document Revised: 12/29/2016 Document Reviewed: 12/29/2015 [employer] Patient Education  2020 [employer]  

## 2018-08-20 NOTE — Progress Notes (Signed)
Subjective:   Lynn Garcia is a 23 y.o. G3P2002 at 30w5dby early ultrasound being seen today for her first obstetrical visit.  Her obstetrical history is significant for term SVD x 2 both SGA, first baby spent time in NICU  and has Chronic hypertension; LGSIL on Pap smear of cervix; SVD (spontaneous vaginal delivery); Supervision of high risk pregnancy, antepartum; History of prior pregnancy with IUGR newborn; Nausea and vomiting during pregnancy prior to [redacted] weeks gestation; and Headache in pregnancy, antepartum, first trimester on their problem list.. Patient does intend to breast feed. Pregnancy history fully reviewed.  Patient reports headache and nausea.  HISTORY: OB History  Gravida Para Term Preterm AB Living  _0 0 0 2  SAB TAB Ectopic Multiple Live Births  0 0 0 0 2    # Outcome Date GA Lbr Len/2nd Weight Sex Delivery Anes PTL Lv  3 Current           2 Term 11/18/16 327w1d0:10 / 00:13 5 lb 7.5 oz (2.48 kg) F Vag-Spont EPI  LIV     Name: LETEMPLE, EWART   Apgar1: 8  Apgar5: 9  1 Term 05/06/15 3954w0d:06 / 01:56 5 lb 2.2 oz (2.33 kg) M Vag-Spont EPI  LIV     Birth Comments: SGA, IUGR, spent time in NICU     Name: LEWJorene Minors  Apgar1: 6  Apgar5: 9    Obstetric Comments  Oligohydramnios   Past Medical History:  Diagnosis Date  . Hypertension   . UTI (urinary tract infection)    Past Surgical History:  Procedure Laterality Date  . NO PAST SURGERIES     Family History  Problem Relation Age of Onset  . Hypertension Mother    Social History   Tobacco Use  . Smoking status: Former Smoker    Quit date: 03/2016    Years since quitting: 2.3  . Smokeless tobacco: Never Used  Substance Use Topics  . Alcohol use: No  . Drug use: No   No Known Allergies Current Outpatient Medications on File Prior to Visit  Medication Sig Dispense Refill  . acetaminophen (TYLENOL) 500 MG tablet Take 500 mg by mouth every 6 (six) hours as needed for mild  pain or headache.    . aMarland KitchenLODipine (NORVASC) 5 MG tablet Take 1 tablet (5 mg total) by mouth daily. 30 tablet 1  . senna-docusate (SENOKOT-S) 8.6-50 MG tablet Take 2 tablets by mouth at bedtime as needed for mild constipation. (Patient not taking: Reported on 08/20/2018) 6 tablet 0  . Vitamin D, Ergocalciferol, (DRISDOL) 50000 units CAPS capsule Take 1 capsule (50,000 Units total) by mouth every 7 (seven) days. (Patient not taking: Reported on 08/20/2018) 30 capsule 2   No current facility-administered medications on file prior to visit.      Exam   Vitals:   08/20/18 1009 08/20/18 1011  BP: 132/74   Pulse: 75   Weight: 134 lb (60.8 kg)   Height:  5' (1.524 m)      Uterus:     Pelvic Exam: Perineum: no hemorrhoids, normal perineum   Vulva: normal external genitalia, no lesions   Vagina:  normal mucosa, normal discharge   Cervix: no lesions and normal, pap smear done.    Adnexa: normal adnexa and no mass, fullness, tenderness   Bony Pelvis: average  System: General: well-developed, well-nourished female in no acute distress   Breast:  normal appearance, no masses  or tenderness   Skin: normal coloration and turgor, no rashes   Neurologic: oriented, normal, negative, normal mood   Extremities: normal strength, tone, and muscle mass, ROM of all joints is normal   HEENT PERRLA, extraocular movement intact and sclera clear, anicteric   Mouth/Teeth mucous membranes moist, pharynx normal without lesions and dental hygiene good   Neck supple and no masses   Cardiovascular: regular rate and rhythm   Respiratory:  no respiratory distress, normal breath sounds   Abdomen: soft, non-tender; bowel sounds normal; no masses,  no organomegaly     Assessment:   Pregnancy: [id number] Patient Active Problem List   Diagnosis Date Noted  . Supervision of high risk pregnancy, antepartum 08/20/2018  . History of prior pregnancy with IUGR newborn 08/20/2018  . Nausea and vomiting during pregnancy  prior to [redacted] weeks gestation 08/20/2018  . Headache in pregnancy, antepartum, first trimester 08/20/2018  . SVD (spontaneous vaginal delivery) 11/17/2016  . 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 Chronic hypertension 05/05/2015     Plan:  1. Chronic hypertension complicating or reason for care during pregnancy, first trimester --Start ASA now at 12 weeks.   --Baseline labs today. --No medications currently, normotensive at visit today. - aspirin EC 81 MG tablet; Take 1 tablet (81 mg total) by mouth daily.  Dispense: 30 tablet; Refill: 6  2. Supervision of high risk pregnancy, antepartum --Anticipatory guidance about next visits/weeks of pregnancy given.  --Reviewed safety, visitor policy, and COVID testing for scheduled IOL or and C/S and for active labor admission.  Reassurance about COVID-19 with young healthy women according to current data. Discussed possible changes to visits, including televisits, that may occur due to COVID-19.  The office remains open if pt needs to be seen and MAU is open 24 hours/day for OB emergencies.  - Obstetric Panel, Including HIV - Culture, OB Urine - Genetic Screening - Blood Pressure Monitor KIT; 1 Device by Does not apply route once a week. To be monitored Regularly at home.  Dispense: 1 kit; Refill: 0 - Cytology - PAP( [place]) - Babyscripts Schedule Optimization - Prenat-FeAsp-Meth-FA-DHA w/o A (PRENATE PIXIE) 10-0.6-0.4-200 MG CAPS; Take 1 tablet by mouth daily.  Dispense: 30 capsule; Refill: 12 - [place] MFM OB DETAIL +14 WK; Future  3. History of prior pregnancy with IUGR newborn --First baby 5 lbs, 2.2 oz, NICU x 19 days, now doing well --Second baby 5lbs 7.5 oz, with cartilage abnormality of throat with follow up at [facility] currently  4. Nausea and vomiting during pregnancy prior to [redacted] weeks gestation --Daily nausea, frequent vomiting. Needs medication that does not cause constipation and so she can go to work. - metoCLOPramide (REGLAN)  10 MG tablet; Take 1 tablet (10 mg total) by mouth every 6 (six) hours as needed for nausea.  Dispense: 90 tablet; Refill: 5  5. Headache in pregnancy, antepartum, first trimester --Likely due to mild dehydration, low caloric intake with n/v.  Trying Tylenol but not helping. Pt to try Reglan first, which also helps with h/a,  then if still has h/a can use Fioricet.  - butalbital-acetaminophen-caffeine (FIORICET) 50-325-40 MG tablet; Take 1-2 tablets by mouth every 6 (six) hours as needed for headache.  Dispense: 20 tablet; Refill: 0  6. LGSIL on Pap smear of cervix --In 2018, repeat Pap per ASCCP guidelines.  Pap today.  --Initial labs drawn. Add baseline PEC labs for [facility]. --Rx for prenatal vitamins. --Discussed and offered genetic screening options, including Quad screen/AFP,  NIPS testing, and option to decline testing. Benefits/risks/alternatives reviewed. Pt aware that anatomy US is form of genetic screening with lower accuracy in detecting trisomies than blood work.  Pt chooses genetic screening today. NIPS: ordered. Ultrasound discussed; fetal anatomic survey: ordered. Problem list reviewed and updated.  --The nature of [place] with multiple MDs and other Advanced Practice Providers was explained to patient; also emphasized that residents, students are part of our team. --Routine obstetric precautions reviewed.  Return in about 4 weeks (around 09/17/2018).   Fatima Blank, CNM 08/20/18 11:22 AM

## 2018-08-20 NOTE — Addendum Note (Signed)
Addended by: Courtney Heys on: 08/20/2018 03:49 PM   Modules accepted: Orders

## 2018-08-20 NOTE — Progress Notes (Signed)
NOB   GC/CT: 07/19/18 at MAU NEG  U/S:  07/19/18 Last pap: 05/16//2018 LGSIL CIN1 +HPV  Genetic Testing : Desired    CC: Headaches and Nausea and Vomiting.  Pt needs PNV's pt wants pixies or any small pills

## 2018-08-21 LAB — OBSTETRIC PANEL, INCLUDING HIV
Antibody Screen: NEGATIVE
Basophils Absolute: 0.1 10*3/uL (ref 0.0–0.2)
Basos: 0 %
EOS (ABSOLUTE): 0.1 10*3/uL (ref 0.0–0.4)
Eos: 1 %
HIV Screen 4th Generation wRfx: NONREACTIVE
Hematocrit: 40.1 % (ref 34.0–46.6)
Hemoglobin: 13.2 g/dL (ref 11.1–15.9)
Hepatitis B Surface Ag: NEGATIVE
Immature Grans (Abs): 0.1 10*3/uL (ref 0.0–0.1)
Immature Granulocytes: 1 %
Lymphocytes Absolute: 2.1 10*3/uL (ref 0.7–3.1)
Lymphs: 19 %
MCH: 29.8 pg (ref 26.6–33.0)
MCHC: 32.9 g/dL (ref 31.5–35.7)
MCV: 91 fL (ref 79–97)
Monocytes Absolute: 0.7 10*3/uL (ref 0.1–0.9)
Monocytes: 7 %
Neutrophils Absolute: 8.1 10*3/uL — ABNORMAL HIGH (ref 1.4–7.0)
Neutrophils: 72 %
Platelets: 218 10*3/uL (ref 150–450)
RBC: 4.43 x10E6/uL (ref 3.77–5.28)
RDW: 12.5 % (ref 11.7–15.4)
RPR Ser Ql: NONREACTIVE
Rh Factor: POSITIVE
Rubella Antibodies, IGG: 4.77 index (ref >0.99)
WBC: 11.2 10*3/uL — ABNORMAL HIGH (ref 3.4–10.8)

## 2018-08-21 LAB — PROTEIN / CREATININE RATIO, URINE
Creatinine, Urine: 113.2 mg/dL
Protein, Ur: 18 mg/dL
Protein/Creat Ratio: 159 mg/g{creat} (ref 0–200)

## 2018-08-22 LAB — CYTOLOGY - PAP

## 2018-08-23 LAB — CULTURE, OB URINE

## 2018-08-23 LAB — URINE CULTURE, OB REFLEX

## 2018-08-26 ENCOUNTER — Encounter: Payer: Self-pay | Admitting: Advanced Practice Midwife

## 2018-09-10 ENCOUNTER — Encounter: Payer: Self-pay | Admitting: Advanced Practice Midwife

## 2018-09-17 ENCOUNTER — Telehealth: Payer: Self-pay | Admitting: Obstetrics and Gynecology

## 2018-09-17 DIAGNOSIS — Z20828 Contact with and (suspected) exposure to other viral communicable diseases: Secondary | ICD-10-CM | POA: Diagnosis not present

## 2018-09-30 IMAGING — US US ART/VEN ABD/PELV/SCROTUM DOPPLER LTD
1 series · 14 of 25 positions shown · non-contrast
Comparison: none

CLINICAL DATA: Right adnexal pain.

EXAM:
LIMITED OBSTETRIC ULTRASOUND AN DOPPLER

[Series 1: us art/ven abd/pelv/scrotum doppler ltd · 0.23mm/px · 14 of 57 slices shown]
[im 1/57]
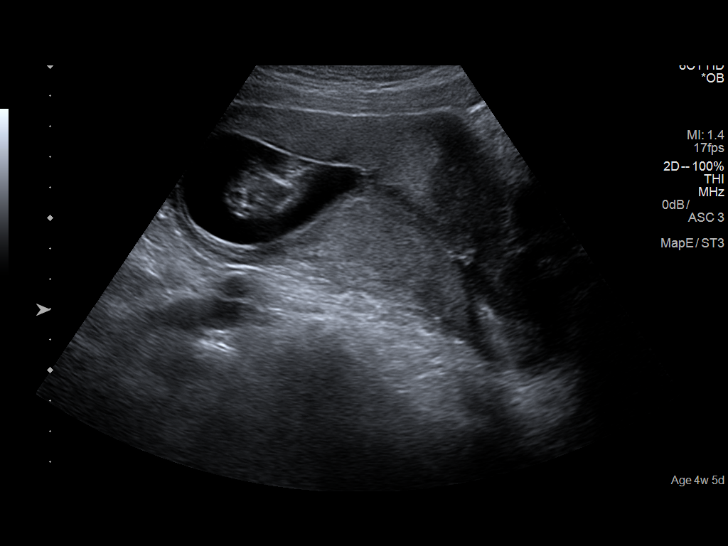
[im 5/57]
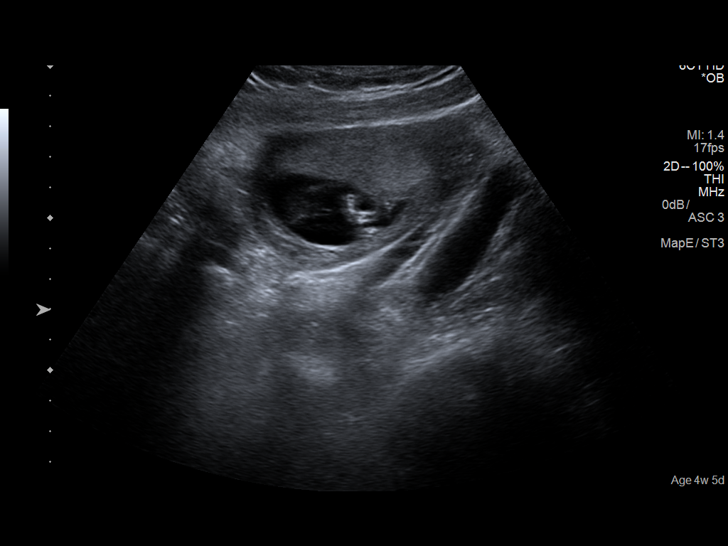
[im 10/57]
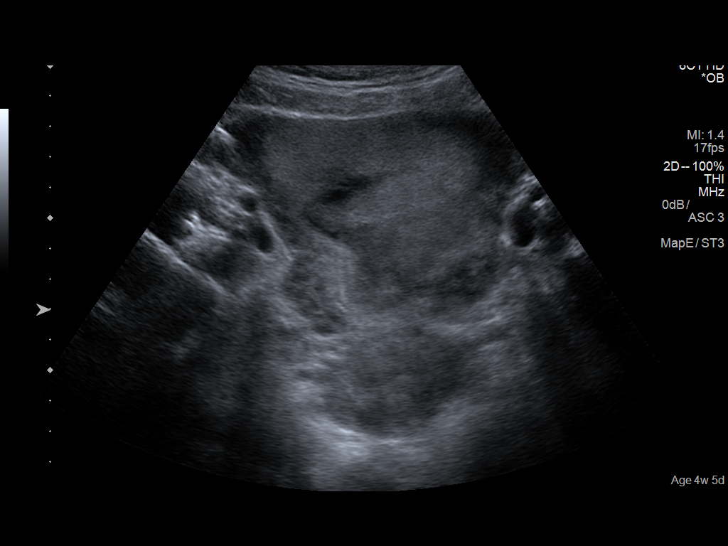
[im 15/57]
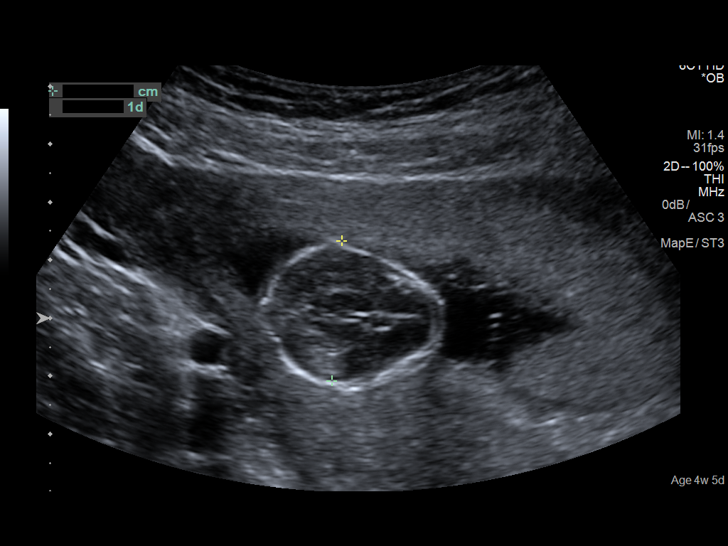
[im 19/57]
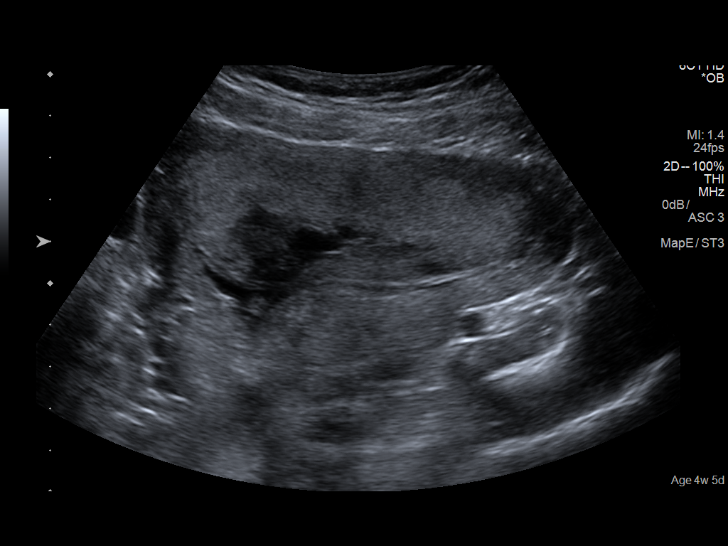
[im 22/57]
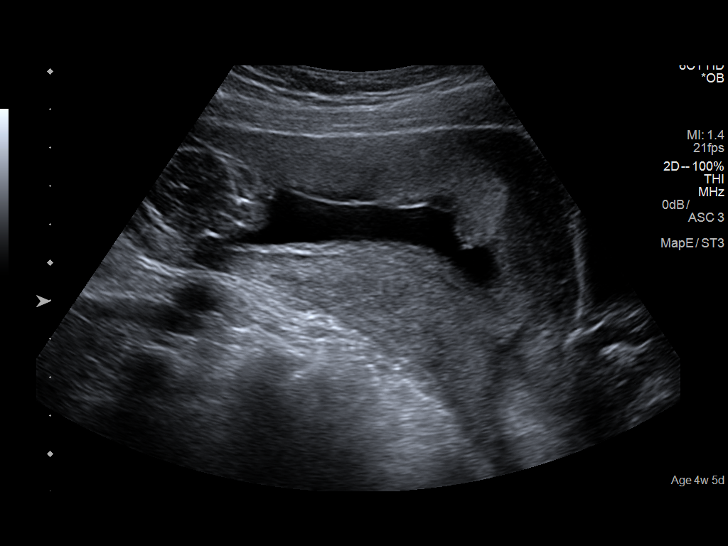
[im 26/57]
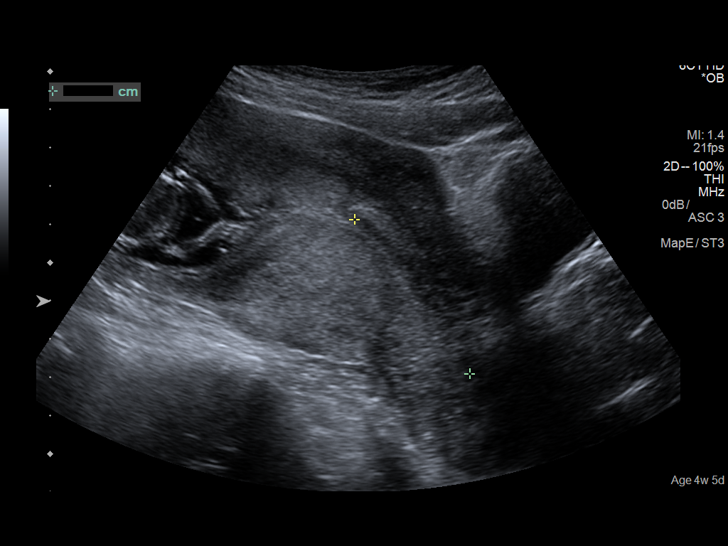
[im 31/57]
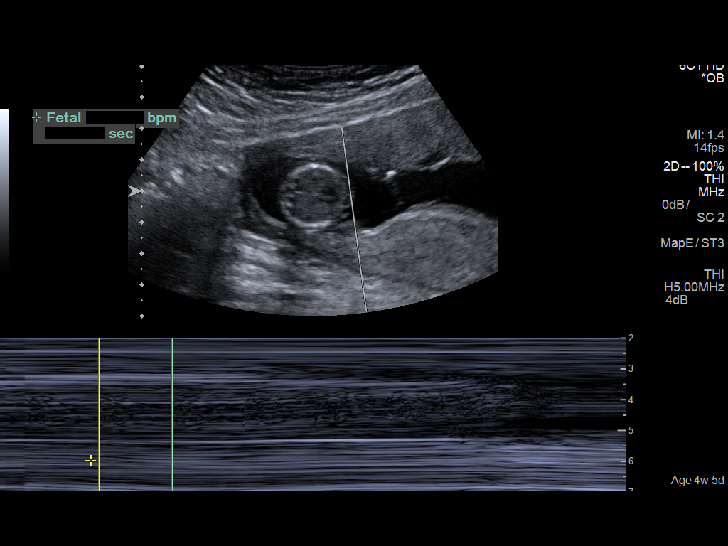
[im 36/57]
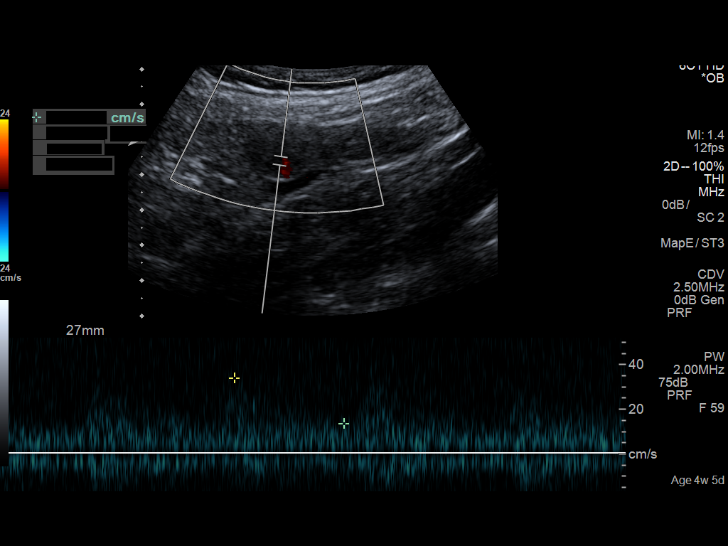
[im 38/57]
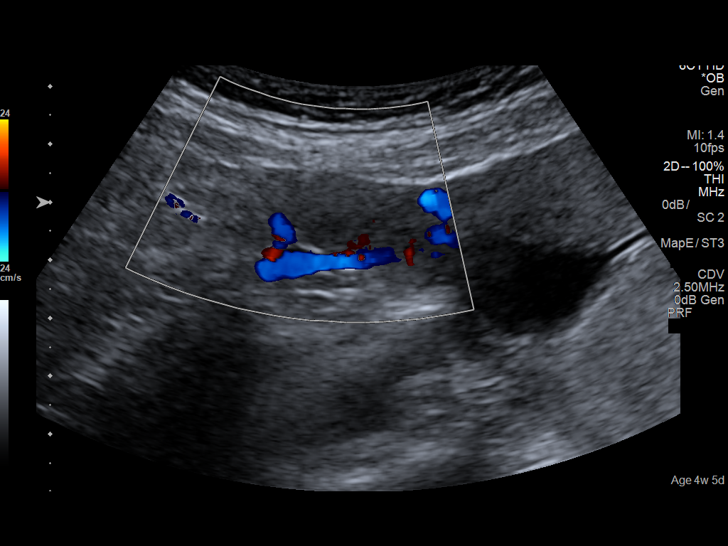
[im 43/57]
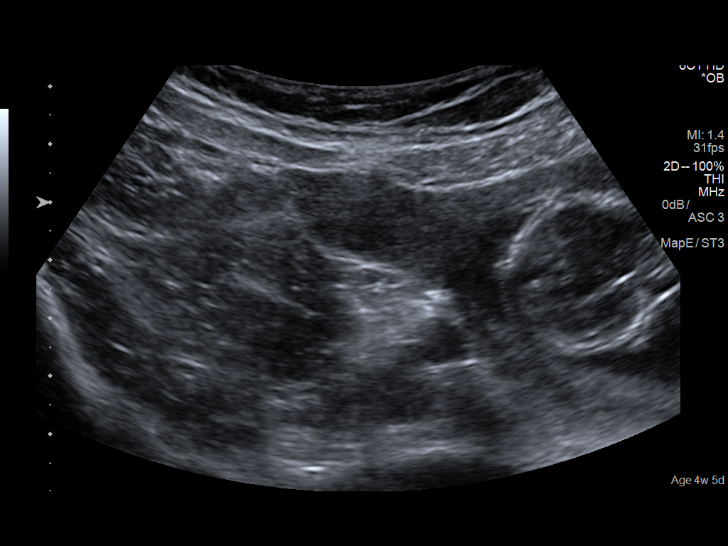
[im 47/57]
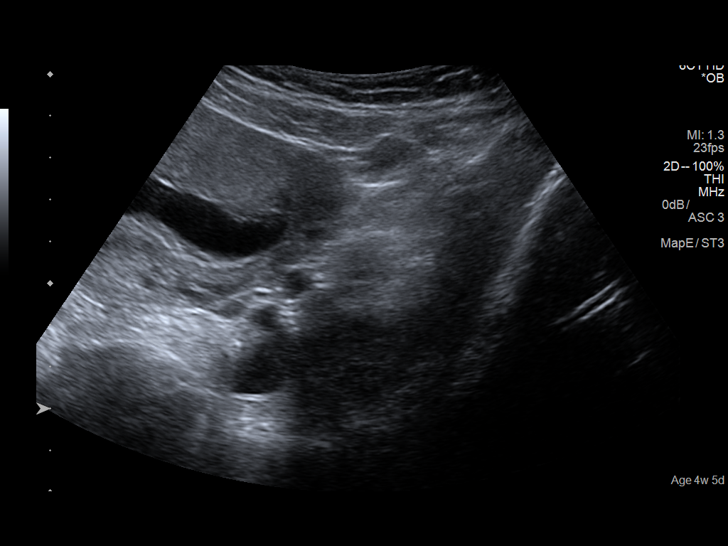
[im 52/57]
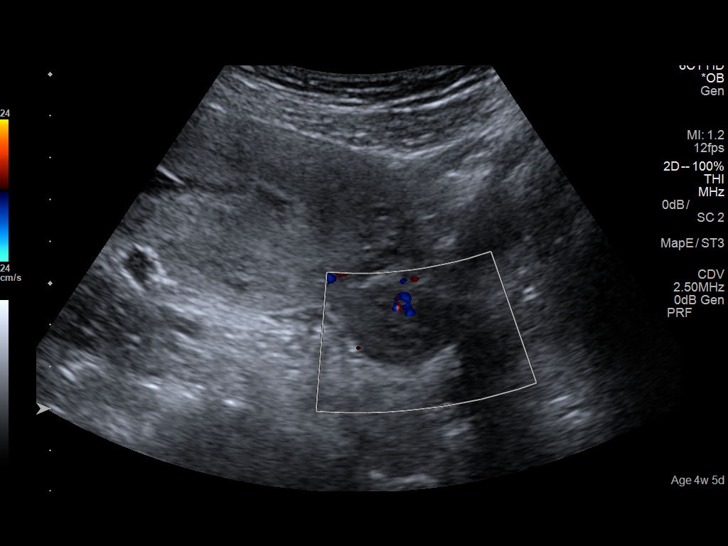
[im 57/57]
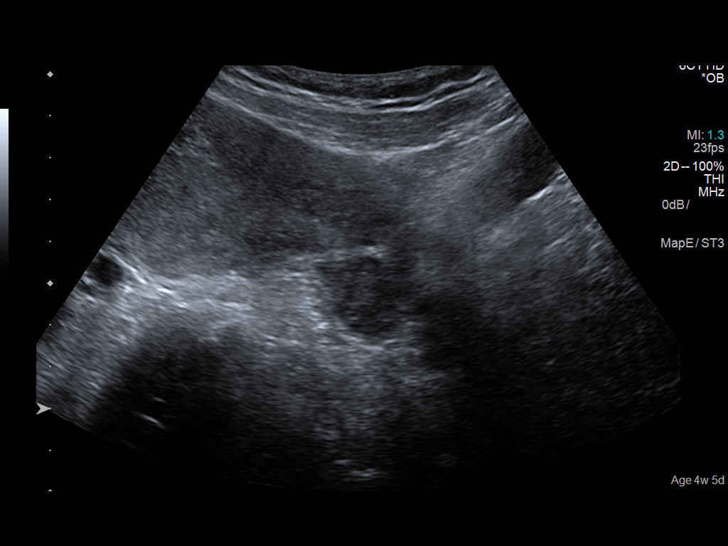

[14 of 25 positions shown; findings below may reference images not displayed]

FINDINGS: Number of Fetuses: 1

Heart Rate:  140 bpm

Movement: Present

Presentation: Transverse

Placental Location: Anterior

Previa: None

Amniotic Fluid (Subjective):  Within normal limits.

BPD:  2.42cm 14 w  1d

MATERNAL FINDINGS:

Cervix:  Appears closed.

Uterus/Adnexae: No abnormality visualized.

Pulsed Doppler evaluation of both ovaries demonstrates normal
low-resistance arterial and venous waveforms.

Other findings

No free fluid.
IMPRESSION: Single live intrauterine pregnancy corresponding to 14 weeks and 1
day gestation.

Normal appearance of the maternal adnexa.

This exam is performed on an emergent basis and does not
comprehensively evaluate fetal size, dating, or anatomy; follow-up
complete OB US should be considered if further fetal assessment is
warranted.

## 2018-10-02 ENCOUNTER — Telehealth: Payer: Self-pay | Admitting: Obstetrics and Gynecology

## 2018-10-03 ENCOUNTER — Ambulatory Visit (HOSPITAL_COMMUNITY)
Admission: RE | Admit: 2018-10-03 | Discharge: 2018-10-03 | Disposition: A | Discharge status: Home / Self Care | Payer: Self-pay | Source: Ambulatory Visit | Attending: Obstetrics and Gynecology | Admitting: Obstetrics and Gynecology

## 2018-10-03 ENCOUNTER — Ambulatory Visit (HOSPITAL_COMMUNITY): Payer: Self-pay

## 2018-10-09 ENCOUNTER — Telehealth: Payer: Self-pay | Admitting: Obstetrics and Gynecology

## 2018-10-10 ENCOUNTER — Inpatient Hospital Stay (HOSPITAL_COMMUNITY)
Admission: AD | Admit: 2018-10-10 | Discharge: 2018-10-10 | Disposition: A | Discharge status: Home / Self Care | Payer: Medicaid Other | Source: Ambulatory Visit | Attending: Obstetrics & Gynecology | Admitting: Obstetrics & Gynecology

## 2018-10-10 ENCOUNTER — Other Ambulatory Visit: Payer: Self-pay

## 2018-10-10 ENCOUNTER — Encounter (HOSPITAL_COMMUNITY): Payer: Self-pay | Admitting: Advanced Practice Midwife

## 2018-10-10 DIAGNOSIS — Z87891 Personal history of nicotine dependence: Secondary | ICD-10-CM | POA: Diagnosis not present

## 2018-10-10 DIAGNOSIS — Z7982 Long term (current) use of aspirin: Secondary | ICD-10-CM | POA: Diagnosis not present

## 2018-10-10 DIAGNOSIS — R109 Unspecified abdominal pain: Secondary | ICD-10-CM | POA: Diagnosis present

## 2018-10-10 DIAGNOSIS — Z79899 Other long term (current) drug therapy: Secondary | ICD-10-CM | POA: Insufficient documentation

## 2018-10-10 DIAGNOSIS — O9989 Other specified diseases and conditions complicating pregnancy, childbirth and the puerperium: Secondary | ICD-10-CM | POA: Diagnosis not present

## 2018-10-10 DIAGNOSIS — M7918 Myalgia, other site: Secondary | ICD-10-CM | POA: Diagnosis not present

## 2018-10-10 DIAGNOSIS — Z3A19 19 weeks gestation of pregnancy: Secondary | ICD-10-CM | POA: Diagnosis not present

## 2018-10-10 DIAGNOSIS — R1031 Right lower quadrant pain: Secondary | ICD-10-CM | POA: Diagnosis not present

## 2018-10-10 DIAGNOSIS — O26892 Other specified pregnancy related conditions, second trimester: Secondary | ICD-10-CM | POA: Diagnosis not present

## 2018-10-10 DIAGNOSIS — O162 Unspecified maternal hypertension, second trimester: Secondary | ICD-10-CM | POA: Diagnosis not present

## 2018-10-10 DIAGNOSIS — Z3492 Encounter for supervision of normal pregnancy, unspecified, second trimester: Secondary | ICD-10-CM

## 2018-10-10 LAB — URINALYSIS, ROUTINE W REFLEX MICROSCOPIC
Bilirubin Urine: NEGATIVE
Glucose, UA: NEGATIVE mg/dL
Hgb urine dipstick: NEGATIVE
Ketones, ur: NEGATIVE mg/dL
Leukocytes,Ua: NEGATIVE
Nitrite: NEGATIVE
Protein, ur: NEGATIVE mg/dL
Specific Gravity, Urine: 1.023 (ref 1.005–1.030)
pH: 6 (ref 5.0–8.0)

## 2018-10-10 LAB — WET PREP, GENITAL
Clue Cells Wet Prep HPF POC: NONE SEEN
Trich, Wet Prep: NONE SEEN

## 2018-10-10 MED ORDER — CYCLOBENZAPRINE HCL 10 MG PO TABS
10.0000 mg | ORAL_TABLET | Freq: Once | ORAL | Status: AC
  Administered 2018-10-10: 02:00:00 10 mg via ORAL
  Filled 2018-10-10: qty 1

## 2018-10-10 MED ORDER — TERCONAZOLE 0.4 % VA CREA: 1.0000 | TOPICAL_CREAM | Freq: Every day | VAGINAL | 0 refills | Status: DC

## 2018-10-10 NOTE — Discharge Instructions (Signed)

## 2018-10-10 NOTE — MAU Provider Note (Signed)
History     CSN: [id number]  Arrival date and time: 10/10/18 8185   First Provider Initiated Contact with Patient 10/10/18 0200      Chief Complaint  Patient presents with  . Abdominal Pain   HPI Lynn Garcia is a 23 y.o. [id number] at 6w4dwho presents to MAU with chief complaint of RLQ pain. This is a new problem, onset today. Patient works at [employer] endorses a significant amount of bending, twisting and stretching during her shift last night. When she started her shift tonight, she discovered new onset RLQ pain which accompanied all twisting movements as well as any time she raised her arm. Her pain does not radiate. She denies alleviating factors. She has not taken medication or tried other treatments for these complaints. She denies vaginal bleeding, leaking of fluid, decreased fetal movement, fever, falls, or recent illness.   [facility] (2017 and 2018).  OB History    Gravida  3   Para  2   Term  2   Preterm      AB      Living  2     SAB      TAB      Ectopic      Multiple  0   Live Births  2        Obstetric Comments  Oligohydramnios        Past Medical History:  Diagnosis Date  . Hypertension   . SVD (spontaneous vaginal delivery) 11/17/2016  . UTI (urinary tract infection)     Past Surgical History:  Procedure Laterality Date  . NO PAST SURGERIES      Family History  Problem Relation Age of Onset  . Hypertension Mother     Social History   Tobacco Use  . Smoking status: Former Smoker    Quit date: 03/2016    Years since quitting: 2.5  . Smokeless tobacco: Never Used  Substance Use Topics  . Alcohol use: No  . Drug use: No    Allergies: No Known Allergies  Medications Prior to Admission  Medication Sig Dispense Refill Last Dose  . aspirin EC 81 MG tablet Take 1 tablet (81 mg total) by mouth daily. 30 tablet 6 10/09/2018 at Unknown time  . butalbital-acetaminophen-caffeine  (FIORICET) 50-325-40 MG tablet Take 1-2 tablets by mouth every 6 (six) hours as needed for headache. 20 tablet 0 Past Week at Unknown time  . metoCLOPramide (REGLAN) 10 MG tablet Take 1 tablet (10 mg total) by mouth every 6 (six) hours as needed for nausea. 90 tablet 5 10/10/2018 at Unknown time  . acetaminophen (TYLENOL) 500 MG tablet Take 500 mg by mouth every 6 (six) hours as needed for mild pain or headache.     .Marland KitchenamLODipine (NORVASC) 5 MG tablet Take 1 tablet (5 mg total) by mouth daily. 30 tablet 1   . Blood Pressure Monitor KIT 1 Device by Does not apply route once a week. To be monitored Regularly at home. 1 kit 0   . Prenat-FeAsp-Meth-FA-DHA w/o A (PRENATE PIXIE) 10-0.6-0.4-200 MG CAPS Take 1 tablet by mouth daily. 30 capsule 12   . senna-docusate (SENOKOT-S) 8.6-50 MG tablet Take 2 tablets by mouth at bedtime as needed for mild constipation. (Patient not taking: Reported on 08/20/2018) 6 tablet 0   . Vitamin D, Ergocalciferol, (DRISDOL) 50000 units CAPS capsule Take 1 capsule (50,000 Units total) by mouth every 7 (seven) days. (  Patient not taking: Reported on 08/20/2018) 30 capsule 2     Review of Systems  Constitutional: Negative for chills, fatigue and fever.  Respiratory: Negative for shortness of breath.   Gastrointestinal: Positive for abdominal pain.  Genitourinary: Negative for difficulty urinating, dysuria, flank pain, vaginal bleeding, vaginal discharge and vaginal pain.  Musculoskeletal: Negative for back pain.  All other systems reviewed and are negative.  Physical Exam   Blood pressure 128/67, pulse 81, temperature 98.3 F (36.8 C), resp. rate 12, weight 67.8 kg, last menstrual period 05/26/2018, SpO2 100 %, unknown if currently breastfeeding.  Physical Exam  Nursing note and vitals reviewed. Constitutional: She is oriented to person, place, and time. She appears well-developed and well-nourished.  Cardiovascular: Normal rate.  Respiratory: Effort normal and breath  sounds normal.  GI: Soft. She exhibits no distension. There is no abdominal tenderness. There is no rebound, no guarding and no CVA tenderness.  Genitourinary:    Vaginal discharge present.     Genitourinary Comments: Collected via blind swab. Moderate amount of thick white discharge noted on swab   Neurological: She is alert and oriented to person, place, and time.  Skin: Skin is dry.  Psychiatric: She has a normal mood and affect. Her behavior is normal. Judgment and thought content normal.    MAU Course/MDM  Procedures  --Swabs collected via blind swab  Patient Vitals for the past 24 hrs:  BP Temp Pulse Resp SpO2 Weight  10/10/18 0308 123/76 - 69 - - -  10/10/18 0147 128/67 - 81 - - -  10/10/18 0145 128/67 98.3 F (36.8 C) 76 12 100 % -  10/10/18 0139 - - - - - 67.8 kg   Results for orders placed or performed during the hospital encounter of 10/10/18 (from the past 24 hour(s))  Urinalysis, Routine w reflex microscopic     Status: None   Collection Time: 10/10/18  1:46 AM  Result Value Ref Range   Color, Urine YELLOW YELLOW   APPearance CLEAR CLEAR   Specific Gravity, Urine 1.023 1.005 - 1.030   pH 6.0 5.0 - 8.0   Glucose, UA NEGATIVE NEGATIVE mg/dL   Hgb urine dipstick NEGATIVE NEGATIVE   Bilirubin Urine NEGATIVE NEGATIVE   Ketones, ur NEGATIVE NEGATIVE mg/dL   Protein, ur NEGATIVE NEGATIVE mg/dL   Nitrite NEGATIVE NEGATIVE   Leukocytes,Ua NEGATIVE NEGATIVE  Wet prep, genital     Status: Abnormal   Collection Time: 10/10/18  2:06 AM  Result Value Ref Range   Yeast Wet Prep HPF POC PRESENT (A) NONE SEEN   Trich, Wet Prep NONE SEEN NONE SEEN   Clue Cells Wet Prep HPF POC NONE SEEN NONE SEEN   WBC, Wet Prep HPF POC MANY (A) NONE SEEN   Sperm PRESENT    Meds ordered this encounter  Medications  . cyclobenzaprine (FLEXERIL) tablet 10 mg  . terconazole (TERAZOL 7) 0.4 % vaginal cream    Sig: Place 1 applicator vaginally at bedtime. Use for seven days    Dispense:  45  g    Refill:  0    Order Specific Question:   Supervising Provider    Answer:   Verita Schneiders A [[id number]]   Assessment and Plan  --23 y.o. [id number] at [redacted]w[redacted]d --[id number] by Doppler --Musculoskeletal pain, denies pain after medications in MAU --Discharge home in stable condition  F/U:Pt not seen in clinic since 08/20/18. Encouraged to schedule virtual 20 week appt with FTarri Fuller CNM 10/10/2018,  3:19 AM

## 2018-10-10 NOTE — MAU Note (Addendum)
Pt states that she has been having right sided sharp abdominal pain for a couple of hours.    Pt tried using the bathroom thinking it was gas, but states that made it hurt more.   Denies vaginal bleeding or LOF.   Reports +fm

## 2018-10-11 LAB — GC/CHLAMYDIA PROBE AMP (~~LOC~~) NOT AT ARMC
Chlamydia: NEGATIVE
Neisseria Gonorrhea: NEGATIVE

## 2018-11-05 ENCOUNTER — Encounter: Payer: Self-pay | Admitting: Obstetrics and Gynecology

## 2018-11-05 ENCOUNTER — Other Ambulatory Visit: Payer: Self-pay

## 2018-11-05 ENCOUNTER — Ambulatory Visit (INDEPENDENT_AMBULATORY_CARE_PROVIDER_SITE_OTHER): Payer: Medicaid Other | Admitting: Obstetrics and Gynecology

## 2018-11-05 VITALS — BP 134/78 | HR 82 | Wt 151.8 lb

## 2018-11-05 DIAGNOSIS — O26892 Other specified pregnancy related conditions, second trimester: Secondary | ICD-10-CM

## 2018-11-05 DIAGNOSIS — O0992 Supervision of high risk pregnancy, unspecified, second trimester: Secondary | ICD-10-CM | POA: Diagnosis not present

## 2018-11-05 DIAGNOSIS — I10 Essential (primary) hypertension: Secondary | ICD-10-CM

## 2018-11-05 DIAGNOSIS — Z3A23 23 weeks gestation of pregnancy: Secondary | ICD-10-CM

## 2018-11-05 DIAGNOSIS — Z8759 Personal history of other complications of pregnancy, childbirth and the puerperium: Secondary | ICD-10-CM

## 2018-11-05 DIAGNOSIS — O0990 Supervision of high risk pregnancy, unspecified, unspecified trimester: Secondary | ICD-10-CM

## 2018-11-05 DIAGNOSIS — R519 Headache, unspecified: Secondary | ICD-10-CM

## 2018-11-05 DIAGNOSIS — O26891 Other specified pregnancy related conditions, first trimester: Secondary | ICD-10-CM

## 2018-11-05 MED ORDER — MISC. DEVICES MISC: 0 refills | Status: AC

## 2018-11-05 MED ORDER — BUTALBITAL-APAP-CAFFEINE 50-325-40 MG PO TABS: 1.0000 | ORAL_TABLET | Freq: Four times a day (QID) | ORAL | 1 refills | Status: AC | PRN

## 2018-11-05 NOTE — Progress Notes (Signed)
Pt presents for ROB requests rf on Fiorcet for HA's and requests a maternity belt.

## 2018-11-05 NOTE — Progress Notes (Signed)
Subjective:  Lynn Garcia is a 23 y.o. G3P2002 at [redacted]w[redacted]d being seen today for ongoing prenatal care.  She is currently monitored for the following issues for this high-risk pregnancy and has Chronic hypertension; LGSIL on Pap smear of cervix; Supervision of high risk pregnancy, antepartum; History of prior pregnancy with IUGR newborn; Nausea and vomiting during pregnancy prior to [redacted] weeks gestation; and Headache in pregnancy, antepartum, first trimester on their problem list.  Patient reports no complaints.   . Vag. Bleeding: None.  Movement: Present. Denies leaking of fluid.   The following portions of the patient's history were reviewed and updated as appropriate: allergies, current medications, past family history, past medical history, past social history, past surgical history and problem list. Problem list updated.  Objective:   Vitals:   11/05/18 1620  BP: 134/78  Pulse: 82  Weight: 151 lb 12.8 oz (68.9 kg)    Fetal Status: Fetal Heart Rate (bpm): 138   Movement: Present     General:  Alert, oriented and cooperative. Patient is in no acute distress.  Skin: Skin is warm and dry. No rash noted.   Cardiovascular: Normal heart rate noted  Respiratory: Normal respiratory effort, no problems with respiration noted  Abdomen: Soft, gravid, appropriate for gestational age. Pain/Pressure: Absent     Pelvic:  Cervical exam deferred        Extremities: Normal range of motion.  Edema: None  Mental Status: Normal mood and affect. Normal behavior. Normal judgment and thought content.   Urinalysis:      Assessment and Plan:  Pregnancy: G3P2002 at [redacted]w[redacted]d  1. Supervision of high risk pregnancy, antepartum     Stable - [place] MFM OB DETAIL +14 WK; Future  2. Chronic hypertension Stable Waiting on BP cuff Continue with BASA and Norvasc - [place] MFM OB DETAIL +14 WK; Future  3. History of prior pregnancy with IUGR newborn - [place] MFM OB DETAIL +14 WK; Future  4. Headache in pregnancy,  antepartum, first trimester  - butalbital-acetaminophen-caffeine (FIORICET) 50-325-40 MG tablet; Take 1-2 tablets by mouth every 6 (six) hours as needed for headache.  Dispense: 20 tablet; Refill: 1  Preterm labor symptoms and general obstetric precautions including but not limited to vaginal bleeding, contractions, leaking of fluid and fetal movement were reviewed in detail with the patient. Please refer to After Visit Summary for other counseling recommendations.  Return in about 4 weeks (around 12/03/2018) for OB visit, face to face for glucola.   Chancy Milroy, MD

## 2018-11-05 NOTE — Patient Instructions (Signed)

## 2018-11-19 ENCOUNTER — Other Ambulatory Visit (HOSPITAL_COMMUNITY): Payer: Self-pay | Admitting: *Deleted

## 2018-11-19 ENCOUNTER — Other Ambulatory Visit: Payer: Self-pay

## 2018-11-19 ENCOUNTER — Ambulatory Visit (HOSPITAL_COMMUNITY)
Admission: RE | Admit: 2018-11-19 | Discharge: 2018-11-19 | Disposition: A | Discharge status: Home / Self Care | Payer: Medicaid Other | Source: Ambulatory Visit | Attending: Obstetrics and Gynecology | Admitting: Obstetrics and Gynecology

## 2018-11-19 ENCOUNTER — Encounter (HOSPITAL_COMMUNITY): Payer: Self-pay

## 2018-11-19 ENCOUNTER — Ambulatory Visit (HOSPITAL_COMMUNITY): Payer: Medicaid Other | Admitting: *Deleted

## 2018-11-19 DIAGNOSIS — O0990 Supervision of high risk pregnancy, unspecified, unspecified trimester: Secondary | ICD-10-CM | POA: Diagnosis not present

## 2018-11-19 DIAGNOSIS — O10913 Unspecified pre-existing hypertension complicating pregnancy, third trimester: Secondary | ICD-10-CM

## 2018-11-19 DIAGNOSIS — O359XX0 Maternal care for (suspected) fetal abnormality and damage, unspecified, not applicable or unspecified: Secondary | ICD-10-CM

## 2018-11-19 DIAGNOSIS — O09892 Supervision of other high risk pregnancies, second trimester: Secondary | ICD-10-CM

## 2018-11-19 DIAGNOSIS — O10012 Pre-existing essential hypertension complicating pregnancy, second trimester: Secondary | ICD-10-CM | POA: Diagnosis not present

## 2018-11-19 DIAGNOSIS — Z3A25 25 weeks gestation of pregnancy: Secondary | ICD-10-CM | POA: Diagnosis not present

## 2018-11-19 DIAGNOSIS — I10 Essential (primary) hypertension: Secondary | ICD-10-CM | POA: Diagnosis not present

## 2018-11-19 DIAGNOSIS — O09292 Supervision of pregnancy with other poor reproductive or obstetric history, second trimester: Secondary | ICD-10-CM

## 2018-11-19 DIAGNOSIS — Z8759 Personal history of other complications of pregnancy, childbirth and the puerperium: Secondary | ICD-10-CM | POA: Diagnosis not present

## 2018-12-03 ENCOUNTER — Encounter: Payer: Self-pay | Admitting: Obstetrics and Gynecology

## 2018-12-03 ENCOUNTER — Encounter: Payer: Self-pay | Admitting: Obstetrics

## 2018-12-03 ENCOUNTER — Ambulatory Visit (INDEPENDENT_AMBULATORY_CARE_PROVIDER_SITE_OTHER): Payer: Medicaid Other | Admitting: Obstetrics and Gynecology

## 2018-12-03 ENCOUNTER — Other Ambulatory Visit: Payer: Self-pay

## 2018-12-03 VITALS — BP 119/72 | HR 78 | Wt 157.3 lb

## 2018-12-03 DIAGNOSIS — O0990 Supervision of high risk pregnancy, unspecified, unspecified trimester: Secondary | ICD-10-CM | POA: Diagnosis not present

## 2018-12-03 DIAGNOSIS — R87612 Low grade squamous intraepithelial lesion on cytologic smear of cervix (LGSIL): Secondary | ICD-10-CM

## 2018-12-03 DIAGNOSIS — O283 Abnormal ultrasonic finding on antenatal screening of mother: Secondary | ICD-10-CM | POA: Diagnosis not present

## 2018-12-03 DIAGNOSIS — O09292 Supervision of pregnancy with other poor reproductive or obstetric history, second trimester: Secondary | ICD-10-CM | POA: Diagnosis not present

## 2018-12-03 DIAGNOSIS — O10012 Pre-existing essential hypertension complicating pregnancy, second trimester: Secondary | ICD-10-CM

## 2018-12-03 DIAGNOSIS — I10 Essential (primary) hypertension: Secondary | ICD-10-CM | POA: Diagnosis not present

## 2018-12-03 DIAGNOSIS — Z3A27 27 weeks gestation of pregnancy: Secondary | ICD-10-CM | POA: Diagnosis not present

## 2018-12-03 DIAGNOSIS — O0992 Supervision of high risk pregnancy, unspecified, second trimester: Secondary | ICD-10-CM

## 2018-12-03 DIAGNOSIS — Z23 Encounter for immunization: Secondary | ICD-10-CM

## 2018-12-03 DIAGNOSIS — Z8759 Personal history of other complications of pregnancy, childbirth and the puerperium: Secondary | ICD-10-CM

## 2018-12-03 NOTE — Progress Notes (Signed)
Pt is here for ROB and 2hr GTT [redacted]w[redacted]d.

## 2018-12-03 NOTE — Progress Notes (Signed)
   PRENATAL VISIT NOTE  Subjective:  Lynn Garcia is a 23 y.o. G3P2002 at [redacted]w[redacted]d being seen today for ongoing prenatal care.  She is currently monitored for the following issues for this high-risk pregnancy and has Chronic hypertension; LGSIL on Pap smear of cervix; Supervision of high risk pregnancy, antepartum; History of prior pregnancy with IUGR newborn; Nausea and vomiting during pregnancy prior to [redacted] weeks gestation; Headache in pregnancy, antepartum, first trimester; and Abnormal fetal ultrasound on their problem list.  Patient reports some pelvic pressure, occasional cramping. Reports occasional stars in vision. Occasional swelling in hands and feet. Occasional headaches that improve with fioricet. Contractions: Irritability. Vag. Bleeding: None.  Movement: Present. Denies leaking of fluid.   The following portions of the patient's history were reviewed and updated as appropriate: allergies, current medications, past family history, past medical history, past social history, past surgical history and problem list.   Objective:   Vitals:   12/03/18 0857  BP: 119/72  Pulse: 78  Weight: 157 lb 4.8 oz (71.4 kg)   Fetal Status: Fetal Heart Rate (bpm): 142   Movement: Present     General:  Alert, oriented and cooperative. Patient is in no acute distress.  Skin: Skin is warm and dry. No rash noted.   Cardiovascular: Normal heart rate noted  Respiratory: Normal respiratory effort, no problems with respiration noted  Abdomen: Soft, gravid, appropriate for gestational age.  Pain/Pressure: Present     Pelvic: Cervical exam deferred        Extremities: Normal range of motion.  Edema: Trace  Mental Status: Normal mood and affect. Normal behavior. Normal judgment and thought content.   Assessment and Plan:  Pregnancy: G3P2002 at [redacted]w[redacted]d  1. Supervision of high risk pregnancy, antepartum Pt reports she got her flu shot at work - CBC - Glucose Tolerance, 2 Hours w/1 Hour - RPR - HIV  Antibody (routine testing w rflx) - Tdap vaccine greater than or equal to 7yo IM For PP IUD  2. Chronic hypertension Cont baby ASA Stable, no meds CMP, UPCr  3. LGSIL on Pap smear of cervix Needs colpo pp  4. History of prior pregnancy with IUGR newborn F/u growth scheduled for 11/19/18  5. Abnormal fetal ultrasound Fetal BL pyelectasis   Preterm labor symptoms and general obstetric precautions including but not limited to vaginal bleeding, contractions, leaking of fluid and fetal movement were reviewed in detail with the patient. Please refer to After Visit Summary for other counseling recommendations.   Return in about 2 weeks (around 12/17/2018) for high OB, virtual.  Future Appointments  Date Time Provider [place]  12/17/2018  8:30 AM McCamey Clymer [facility]  12/17/2018  8:30 AM [facility]-MFC [place] 1 WH-MFCUS [facility]-US    Sloan Leiter, MD

## 2018-12-04 LAB — CBC
Hematocrit: 37.6 % (ref 34.0–46.6)
Hemoglobin: 12.7 g/dL (ref 11.1–15.9)
MCH: 30.5 pg (ref 26.6–33.0)
MCHC: 33.8 g/dL (ref 31.5–35.7)
MCV: 90 fL (ref 79–97)
Platelets: 176 10*3/uL (ref 150–450)
RBC: 4.16 x10E6/uL (ref 3.77–5.28)
RDW: 13 % (ref 11.7–15.4)
WBC: 12.5 10*3/uL — ABNORMAL HIGH (ref 3.4–10.8)

## 2018-12-04 LAB — GLUCOSE TOLERANCE, 2 HOURS W/ 1HR
Glucose, 1 hour: 92 mg/dL (ref 65–179)
Glucose, 2 hour: 65 mg/dL (ref 65–152)
Glucose, Fasting: 81 mg/dL (ref 65–91)

## 2018-12-04 LAB — COMPREHENSIVE METABOLIC PANEL
ALT: 10 IU/L (ref 0–32)
AST: 10 IU/L (ref 0–40)
Albumin/Globulin Ratio: 1.3 (ref 1.2–2.2)
Albumin: 3.3 g/dL — ABNORMAL LOW (ref 3.9–5.0)
Alkaline Phosphatase: 58 IU/L (ref 39–117)
BUN/Creatinine Ratio: 10 (ref 9–23)
BUN: 5 mg/dL — ABNORMAL LOW (ref 6–20)
Bilirubin Total: <0.2 mg/dL (ref 0.0–1.2)
CO2: 21 mmol/L (ref 20–29)
Calcium: 7.7 mg/dL — ABNORMAL LOW (ref 8.7–10.2)
Chloride: 107 mmol/L — ABNORMAL HIGH (ref 96–106)
Creatinine, Ser: 0.5 mg/dL — ABNORMAL LOW (ref 0.57–1.00)
GFR calc Af Amer: 157 mL/min/{1.73_m2} (ref >59)
GFR calc non Af Amer: 136 mL/min/{1.73_m2} (ref >59)
Globulin, Total: 2.5 g/dL (ref 1.5–4.5)
Glucose: 104 mg/dL — ABNORMAL HIGH (ref 65–99)
Potassium: 3.3 mmol/L — ABNORMAL LOW (ref 3.5–5.2)
Sodium: 140 mmol/L (ref 134–144)
Total Protein: 5.8 g/dL — ABNORMAL LOW (ref 6.0–8.5)

## 2018-12-04 LAB — PROTEIN / CREATININE RATIO, URINE
Creatinine, Urine: 145 mg/dL
Protein, Ur: 12 mg/dL
Protein/Creat Ratio: 83 mg/g creat (ref 0–200)

## 2018-12-04 LAB — RPR: RPR Ser Ql: NONREACTIVE

## 2018-12-04 LAB — HIV ANTIBODY (ROUTINE TESTING W REFLEX): HIV Screen 4th Generation wRfx: NONREACTIVE

## 2018-12-10 ENCOUNTER — Telehealth: Payer: Self-pay

## 2018-12-17 ENCOUNTER — Telehealth: Payer: Medicaid Other | Admitting: Obstetrics and Gynecology

## 2018-12-17 ENCOUNTER — Other Ambulatory Visit (HOSPITAL_COMMUNITY): Payer: Self-pay | Admitting: *Deleted

## 2018-12-17 ENCOUNTER — Encounter (HOSPITAL_COMMUNITY): Payer: Self-pay

## 2018-12-17 ENCOUNTER — Other Ambulatory Visit: Payer: Self-pay

## 2018-12-17 ENCOUNTER — Ambulatory Visit (HOSPITAL_COMMUNITY)
Admission: RE | Admit: 2018-12-17 | Discharge: 2018-12-17 | Disposition: A | Discharge status: Home / Self Care | Payer: Medicaid Other | Source: Ambulatory Visit | Attending: Obstetrics and Gynecology | Admitting: Obstetrics and Gynecology

## 2018-12-17 ENCOUNTER — Encounter: Payer: Self-pay | Admitting: Obstetrics and Gynecology

## 2018-12-17 ENCOUNTER — Ambulatory Visit (HOSPITAL_COMMUNITY): Payer: Medicaid Other | Admitting: *Deleted

## 2018-12-17 VITALS — BP 117/79

## 2018-12-17 DIAGNOSIS — O283 Abnormal ultrasonic finding on antenatal screening of mother: Secondary | ICD-10-CM | POA: Diagnosis not present

## 2018-12-17 DIAGNOSIS — O10913 Unspecified pre-existing hypertension complicating pregnancy, third trimester: Secondary | ICD-10-CM

## 2018-12-17 DIAGNOSIS — O10012 Pre-existing essential hypertension complicating pregnancy, second trimester: Secondary | ICD-10-CM | POA: Diagnosis not present

## 2018-12-17 DIAGNOSIS — Z3A29 29 weeks gestation of pregnancy: Secondary | ICD-10-CM | POA: Diagnosis not present

## 2018-12-17 DIAGNOSIS — I10 Essential (primary) hypertension: Secondary | ICD-10-CM

## 2018-12-17 DIAGNOSIS — O0990 Supervision of high risk pregnancy, unspecified, unspecified trimester: Secondary | ICD-10-CM

## 2018-12-17 DIAGNOSIS — O09892 Supervision of other high risk pregnancies, second trimester: Secondary | ICD-10-CM | POA: Diagnosis not present

## 2018-12-17 DIAGNOSIS — Z362 Encounter for other antenatal screening follow-up: Secondary | ICD-10-CM | POA: Diagnosis not present

## 2018-12-17 DIAGNOSIS — O09292 Supervision of pregnancy with other poor reproductive or obstetric history, second trimester: Secondary | ICD-10-CM

## 2018-12-17 NOTE — Progress Notes (Signed)
Patient contacted by nursing staff for [place] visit. Patient never logged on to [place]. Several attempts were made to connect with the patient and voice mail messages were left. Patient never returned phone call.

## 2018-12-18 NOTE — Telephone Encounter (Signed)
Error

## 2019-01-03 ENCOUNTER — Inpatient Hospital Stay (HOSPITAL_COMMUNITY)
Admission: AD | Admit: 2019-01-03 | Discharge: 2019-01-03 | Disposition: A | Discharge status: Home / Self Care | Payer: Medicaid Other | Attending: Obstetrics and Gynecology | Admitting: Obstetrics and Gynecology

## 2019-01-03 ENCOUNTER — Other Ambulatory Visit: Payer: Self-pay

## 2019-01-03 ENCOUNTER — Encounter (HOSPITAL_COMMUNITY): Payer: Self-pay | Admitting: *Deleted

## 2019-01-03 DIAGNOSIS — Z3A31 31 weeks gestation of pregnancy: Secondary | ICD-10-CM

## 2019-01-03 DIAGNOSIS — O0990 Supervision of high risk pregnancy, unspecified, unspecified trimester: Secondary | ICD-10-CM

## 2019-01-03 DIAGNOSIS — O0993 Supervision of high risk pregnancy, unspecified, third trimester: Secondary | ICD-10-CM | POA: Diagnosis not present

## 2019-01-03 DIAGNOSIS — O4703 False labor before 37 completed weeks of gestation, third trimester: Secondary | ICD-10-CM

## 2019-01-03 LAB — URINALYSIS, ROUTINE W REFLEX MICROSCOPIC
Bilirubin Urine: NEGATIVE
Glucose, UA: NEGATIVE mg/dL
Hgb urine dipstick: NEGATIVE
Ketones, ur: NEGATIVE mg/dL
Nitrite: NEGATIVE
Protein, ur: NEGATIVE mg/dL
Specific Gravity, Urine: 1.011 (ref 1.005–1.030)
pH: 6 (ref 5.0–8.0)

## 2019-01-03 LAB — FETAL FIBRONECTIN: Fetal Fibronectin: NEGATIVE

## 2019-01-03 MED ORDER — NIFEDIPINE 10 MG PO CAPS: 10.0000 mg | ORAL_CAPSULE | Freq: Four times a day (QID) | ORAL | 0 refills | Status: AC | PRN

## 2019-01-03 MED ORDER — NIFEDIPINE 10 MG PO CAPS
20.0000 mg | ORAL_CAPSULE | Freq: Once | ORAL | Status: AC
  Administered 2019-01-03: 20:00:00 20 mg via ORAL
  Filled 2019-01-03: qty 2

## 2019-01-03 NOTE — MAU Provider Note (Signed)
CC:  Chief Complaint  Patient presents with  . Abdominal Pain  . Pelvic Pressure  . Rectal Pressure   First Provider Initiated Contact with Patient 01/03/19 1815     HPI: Lynn Garcia is a 23 y.o. year old G43P2002 female at [redacted]w[redacted]d weeks gestation who presents to MAU reporting occasional contractions and constant pressure in her pelvis and bottom since this morning.  Associated Sx:  Vaginal bleeding: Denies Leaking of fluid: Denies Fetal movement: Normal  O:  Patient Vitals for the past 24 hrs:  BP Temp Temp src Pulse Resp SpO2 Height Weight  01/03/19 1748 104/66 98.5 F (36.9 C) Oral 97 20 99 % - -  01/03/19 1747 - - - - - - 5' (1.524 m) 73.8 kg    General: NAD Heart: Regular rate Lungs: Normal rate and effort Abd: Soft, NT, Gravid, S=D Pelvic: NEFG, negative pooling, negative blood.  Dilation: Closed Effacement (%): Thick Cervical Position: Posterior Station: Ballotable Presentation: Undeterminable Exam by:: Marlou Porch, CNM  EFM: 140, Moderate variability, 15 x 15 accelerations, no decelerations Toco: Contractions every 3-5 minutes, mild (pt sleeping)  Orders Placed This Encounter  Procedures  . Culture, OB Urine  . Urinalysis, Routine w reflex microscopic  . Fetal fibronectin  . Discharge patient   Meds ordered this encounter  Medications  . NIFEdipine (PROCARDIA) 10 MG capsule    Sig: Take 1 capsule (10 mg total) by mouth every 6 (six) hours as needed (Greater than 5 contractions per hour).    Dispense:  30 capsule    Refill:  0    Order Specific Question:   Supervising Provider    Answer:   Sloan Leiter [[id number]]    A: [redacted]w[redacted]d week IUP Preterm contractions without evidence of active preterm labor and negative FFN. FHR reactive  P: Discharge home in stable condition. Preterm labor precautions and fetal kick counts. Urine culture pending Follow-up as scheduled for prenatal visit or sooner as needed if symptoms worsen. Return to maternity admissions  as needed if symptoms worsen.  Tamala Julian, [place], [place] 01/03/2019 7:22 PM  3

## 2019-01-03 NOTE — Discharge Instructions (Signed)
Preterm Labor and Birth Information ° °The normal length of a pregnancy is 39-41 weeks. Preterm labor is when labor starts before 37 completed weeks of pregnancy. °What are the risk factors for preterm labor? °Preterm labor is more likely to occur in women who: °· Have certain infections during pregnancy such as a bladder infection, sexually transmitted infection, or infection inside the uterus (chorioamnionitis). °· Have a shorter-than-normal cervix. °· Have gone into preterm labor before. °· Have had surgery on their cervix. °· Are younger than age 17 or older than age 35. °· Are African American. °· Are pregnant with twins or multiple babies (multiple gestation). °· Take street drugs or smoke while pregnant. °· Do not gain enough weight while pregnant. °· Became pregnant shortly after having been pregnant. °What are the symptoms of preterm labor? °Symptoms of preterm labor include: °· Cramps similar to those that can happen during a menstrual period. The cramps may happen with diarrhea. °· Pain in the abdomen or lower back. °· Regular uterine contractions that may feel like tightening of the abdomen. °· A feeling of increased pressure in the pelvis. °· Increased watery or bloody mucus discharge from the vagina. °· Water breaking (ruptured amniotic sac). °Why is it important to recognize signs of preterm labor? °It is important to recognize signs of preterm labor because babies who are born prematurely may not be fully developed. This can put them at an increased risk for: °· Long-term (chronic) heart and lung problems. °· Difficulty immediately after birth with regulating body systems, including blood sugar, body temperature, heart rate, and breathing rate. °· Bleeding in the brain. °· Cerebral palsy. °· Learning difficulties. °· Death. °These risks are highest for babies who are born before 34 weeks of pregnancy. °How is preterm labor treated? °Treatment depends on the length of your pregnancy, your condition,  and the health of your baby. It may involve: °· Having a stitch (suture) placed in your cervix to prevent your cervix from opening too early (cerclage). °· Taking or being given medicines, such as: °? Hormone medicines. These may be given early in pregnancy to help support the pregnancy. °? Medicine to stop contractions. °? Medicines to help mature the baby’s lungs. These may be prescribed if the risk of delivery is high. °? Medicines to prevent your baby from developing cerebral palsy. °If the labor happens before 34 weeks of pregnancy, you may need to stay in the hospital. °What should I do if I think I am in preterm labor? °If you think that you are going into preterm labor, call your health care provider right away. °How can I prevent preterm labor in future pregnancies? °To increase your chance of having a full-term pregnancy: °· Do not use any tobacco products, such as cigarettes, chewing tobacco, and e-cigarettes. If you need help quitting, ask your health care provider. °· Do not use street drugs or medicines that have not been prescribed to you during your pregnancy. °· Talk with your health care provider before taking any herbal supplements, even if you have been taking them regularly. °· Make sure you gain a healthy amount of weight during your pregnancy. °· Watch for infection. If you think that you might have an infection, get it checked right away. °· Make sure to tell your health care provider if you have gone into preterm labor before. °This information is not intended to replace advice given to you by your health care provider. Make sure you discuss any questions you have with your   health care provider. °Document Released: 04/08/2003 Document Revised: 05/10/2018 Document Reviewed: 06/09/2015 °[employer] Patient Education © 2020 [employer] ° °

## 2019-01-03 NOTE — MAU Note (Signed)
Presents with c/o lower abdominal cramping, pelvic & rectal pressure.  Denies VB or LOF.  Reports +FM.

## 2019-01-05 LAB — CULTURE, OB URINE

## 2019-01-14 ENCOUNTER — Ambulatory Visit (HOSPITAL_COMMUNITY): Payer: Medicaid Other | Admitting: *Deleted

## 2019-01-14 ENCOUNTER — Encounter (HOSPITAL_COMMUNITY): Payer: Self-pay

## 2019-01-14 ENCOUNTER — Other Ambulatory Visit: Payer: Self-pay

## 2019-01-14 ENCOUNTER — Ambulatory Visit (HOSPITAL_COMMUNITY)
Admission: RE | Admit: 2019-01-14 | Discharge: 2019-01-14 | Disposition: A | Discharge status: Home / Self Care | Payer: Medicaid Other | Source: Ambulatory Visit | Attending: Obstetrics and Gynecology | Admitting: Obstetrics and Gynecology

## 2019-01-14 DIAGNOSIS — O283 Abnormal ultrasonic finding on antenatal screening of mother: Secondary | ICD-10-CM | POA: Diagnosis not present

## 2019-01-14 DIAGNOSIS — O09893 Supervision of other high risk pregnancies, third trimester: Secondary | ICD-10-CM | POA: Diagnosis not present

## 2019-01-14 DIAGNOSIS — O10013 Pre-existing essential hypertension complicating pregnancy, third trimester: Secondary | ICD-10-CM | POA: Diagnosis not present

## 2019-01-14 DIAGNOSIS — O0990 Supervision of high risk pregnancy, unspecified, unspecified trimester: Secondary | ICD-10-CM | POA: Insufficient documentation

## 2019-01-14 DIAGNOSIS — Z3A33 33 weeks gestation of pregnancy: Secondary | ICD-10-CM | POA: Diagnosis not present

## 2019-01-14 DIAGNOSIS — O10913 Unspecified pre-existing hypertension complicating pregnancy, third trimester: Secondary | ICD-10-CM

## 2019-01-14 DIAGNOSIS — Z362 Encounter for other antenatal screening follow-up: Secondary | ICD-10-CM

## 2019-01-15 ENCOUNTER — Other Ambulatory Visit (HOSPITAL_COMMUNITY): Payer: Self-pay | Admitting: *Deleted

## 2019-01-15 DIAGNOSIS — O10919 Unspecified pre-existing hypertension complicating pregnancy, unspecified trimester: Secondary | ICD-10-CM

## 2019-02-11 ENCOUNTER — Encounter (HOSPITAL_COMMUNITY): Payer: Self-pay

## 2019-02-11 ENCOUNTER — Ambulatory Visit (HOSPITAL_COMMUNITY): Payer: Medicaid Other

## 2019-02-11 ENCOUNTER — Ambulatory Visit (HOSPITAL_COMMUNITY): Payer: Medicaid Other | Attending: Obstetrics and Gynecology
